# Patient Record
Sex: Male | Born: 1940 | Race: White | Hispanic: No | Marital: Married | Smoking: Former smoker
Health system: Southern US, Community
[De-identification: ages and names within clinical notes are randomized; demographics above are authoritative.]

## PROBLEM LIST (undated history)

## (undated) DIAGNOSIS — I10 Essential (primary) hypertension: Secondary | ICD-10-CM

## (undated) DIAGNOSIS — M25472 Effusion, left ankle: Secondary | ICD-10-CM

## (undated) DIAGNOSIS — E119 Type 2 diabetes mellitus without complications: Secondary | ICD-10-CM

## (undated) DIAGNOSIS — M25471 Effusion, right ankle: Secondary | ICD-10-CM

## (undated) DIAGNOSIS — E7800 Pure hypercholesterolemia, unspecified: Secondary | ICD-10-CM

## (undated) DIAGNOSIS — G459 Transient cerebral ischemic attack, unspecified: Secondary | ICD-10-CM

## (undated) DIAGNOSIS — I639 Cerebral infarction, unspecified: Secondary | ICD-10-CM

## (undated) HISTORY — PX: HERNIA REPAIR: SHX51

## (undated) HISTORY — DX: Effusion, left ankle: M25.472

## (undated) HISTORY — DX: Cerebral infarction, unspecified: I63.9

## (undated) HISTORY — DX: Effusion, left ankle: M25.471

## (undated) HISTORY — PX: TONSILLECTOMY: SUR1361

---

## 2009-07-10 DIAGNOSIS — G459 Transient cerebral ischemic attack, unspecified: Secondary | ICD-10-CM

## 2009-07-10 HISTORY — DX: Transient cerebral ischemic attack, unspecified: G45.9

## 2010-07-10 HISTORY — PX: CATARACT EXTRACTION: SUR2

## 2010-07-26 ENCOUNTER — Ambulatory Visit: Payer: Self-pay | Admitting: Ophthalmology

## 2010-08-02 ENCOUNTER — Ambulatory Visit: Payer: Self-pay | Admitting: Ophthalmology

## 2010-09-23 ENCOUNTER — Ambulatory Visit: Payer: Self-pay | Admitting: Ophthalmology

## 2013-07-10 HISTORY — PX: CYSTOSCOPY: SUR368

## 2014-11-16 ENCOUNTER — Inpatient Hospital Stay (HOSPITAL_COMMUNITY)
Admission: EM | Admit: 2014-11-16 | Discharge: 2014-11-18 | DRG: 065 | Disposition: A | Discharge status: Home / Self Care | Payer: Medicare Other | Attending: Internal Medicine | Admitting: Internal Medicine

## 2014-11-16 ENCOUNTER — Encounter (HOSPITAL_COMMUNITY): Payer: Self-pay | Admitting: *Deleted

## 2014-11-16 ENCOUNTER — Emergency Department (HOSPITAL_COMMUNITY): Payer: Medicare Other

## 2014-11-16 DIAGNOSIS — I10 Essential (primary) hypertension: Secondary | ICD-10-CM | POA: Diagnosis not present

## 2014-11-16 DIAGNOSIS — F419 Anxiety disorder, unspecified: Secondary | ICD-10-CM | POA: Diagnosis present

## 2014-11-16 DIAGNOSIS — E119 Type 2 diabetes mellitus without complications: Secondary | ICD-10-CM

## 2014-11-16 DIAGNOSIS — R4701 Aphasia: Secondary | ICD-10-CM | POA: Diagnosis present

## 2014-11-16 DIAGNOSIS — I638 Other cerebral infarction: Secondary | ICD-10-CM | POA: Diagnosis not present

## 2014-11-16 DIAGNOSIS — E6609 Other obesity due to excess calories: Secondary | ICD-10-CM | POA: Diagnosis present

## 2014-11-16 DIAGNOSIS — E785 Hyperlipidemia, unspecified: Secondary | ICD-10-CM | POA: Diagnosis present

## 2014-11-16 DIAGNOSIS — Z6834 Body mass index (BMI) 34.0-34.9, adult: Secondary | ICD-10-CM

## 2014-11-16 DIAGNOSIS — Z79899 Other long term (current) drug therapy: Secondary | ICD-10-CM

## 2014-11-16 DIAGNOSIS — I639 Cerebral infarction, unspecified: Secondary | ICD-10-CM

## 2014-11-16 DIAGNOSIS — Z7982 Long term (current) use of aspirin: Secondary | ICD-10-CM

## 2014-11-16 DIAGNOSIS — F329 Major depressive disorder, single episode, unspecified: Secondary | ICD-10-CM | POA: Diagnosis present

## 2014-11-16 DIAGNOSIS — E780 Pure hypercholesterolemia: Secondary | ICD-10-CM | POA: Diagnosis present

## 2014-11-16 DIAGNOSIS — I82531 Chronic embolism and thrombosis of right popliteal vein: Secondary | ICD-10-CM | POA: Diagnosis present

## 2014-11-16 DIAGNOSIS — I82512 Chronic embolism and thrombosis of left femoral vein: Secondary | ICD-10-CM | POA: Diagnosis present

## 2014-11-16 DIAGNOSIS — R4781 Slurred speech: Secondary | ICD-10-CM | POA: Diagnosis not present

## 2014-11-16 DIAGNOSIS — Z8673 Personal history of transient ischemic attack (TIA), and cerebral infarction without residual deficits: Secondary | ICD-10-CM

## 2014-11-16 HISTORY — DX: Pure hypercholesterolemia, unspecified: E78.00

## 2014-11-16 HISTORY — DX: Type 2 diabetes mellitus without complications: E11.9

## 2014-11-16 HISTORY — DX: Essential (primary) hypertension: I10

## 2014-11-16 HISTORY — DX: Transient cerebral ischemic attack, unspecified: G45.9

## 2014-11-16 LAB — COMPREHENSIVE METABOLIC PANEL
ALK PHOS: 49 U/L (ref 38–126)
ALT: 13 U/L — AB (ref 17–63)
AST: 23 U/L (ref 15–41)
Albumin: 3.6 g/dL (ref 3.5–5.0)
Anion gap: 9 (ref 5–15)
BUN: 19 mg/dL (ref 6–20)
CO2: 25 mmol/L (ref 22–32)
Calcium: 9.7 mg/dL (ref 8.9–10.3)
Chloride: 102 mmol/L (ref 101–111)
Creatinine, Ser: 1.06 mg/dL (ref 0.61–1.24)
GLUCOSE: 143 mg/dL — AB (ref 70–99)
POTASSIUM: 4 mmol/L (ref 3.5–5.1)
SODIUM: 136 mmol/L (ref 135–145)
Total Bilirubin: 1 mg/dL (ref 0.3–1.2)
Total Protein: 6.7 g/dL (ref 6.5–8.1)

## 2014-11-16 LAB — APTT: aPTT: 27 seconds (ref 24–37)

## 2014-11-16 LAB — I-STAT TROPONIN, ED: TROPONIN I, POC: 0.01 ng/mL (ref 0.00–0.08)

## 2014-11-16 LAB — DIFFERENTIAL
BASOS ABS: 0 10*3/uL (ref 0.0–0.1)
BASOS PCT: 0 % (ref 0–1)
EOS ABS: 0 10*3/uL (ref 0.0–0.7)
Eosinophils Relative: 0 % (ref 0–5)
Lymphocytes Relative: 22 % (ref 12–46)
Lymphs Abs: 1.7 10*3/uL (ref 0.7–4.0)
MONO ABS: 0.7 10*3/uL (ref 0.1–1.0)
Monocytes Relative: 10 % (ref 3–12)
NEUTROS ABS: 5.2 10*3/uL (ref 1.7–7.7)
NEUTROS PCT: 68 % (ref 43–77)

## 2014-11-16 LAB — CBC
HCT: 43.9 % (ref 39.0–52.0)
HEMOGLOBIN: 14.8 g/dL (ref 13.0–17.0)
MCH: 32 pg (ref 26.0–34.0)
MCHC: 33.7 g/dL (ref 30.0–36.0)
MCV: 94.8 fL (ref 78.0–100.0)
Platelets: 136 10*3/uL — ABNORMAL LOW (ref 150–400)
RBC: 4.63 MIL/uL (ref 4.22–5.81)
RDW: 14.2 % (ref 11.5–15.5)
WBC: 7.7 10*3/uL (ref 4.0–10.5)

## 2014-11-16 LAB — PROTIME-INR
INR: 1.05 (ref 0.00–1.49)
PROTHROMBIN TIME: 13.8 s (ref 11.6–15.2)

## 2014-11-16 NOTE — ED Notes (Signed)
MD at bedside. 

## 2014-11-16 NOTE — H&P (Signed)
[facility] History and Physical  Eugene Dudley R Blust MWN:[id number]:[id number] DOB: 04/12/1941    PCP:   PROVIDER NOT IN SYSTEM   Chief Complaint: slurred speech, found to have acute CVA.   HPI: Eugene Dudley is an 74 y.o. male with hx of HTN, DM2, prior TIA and CVA over 10 years ago, hx of HLD on statin, presented to the ER with slurred speech.  He did not have any focal weakness, and in the ER, his speech was improving. He was not a candidate for TPA as his symptoms were mild and he improved.  Work up in the ER included a head CT which showed no acute process, and an MRI showed acute left lateral and posterior temporal and occipital CVA, small.  He denied facial droop, focal weakness, or visual changes.  He was seen in consultation with neurohospitalist, who recommended that he be admitted for stroke work up.  He has been taking ASA, and in the past, had taken Plavix, but he " didn't like how it made me feel".   Hospitalist was asked to admit him for same.   Rewiew of Systems:  Constitutional: Negative for malaise, fever and chills. No significant weight loss or weight gain Eyes: Negative for eye pain, redness and discharge, diplopia, visual changes, or flashes of light. ENMT: Negative for ear pain, hoarseness, nasal congestion, sinus pressure and sore throat. No headaches; tinnitus, drooling, or problem swallowing. Cardiovascular: Negative for chest pain, palpitations, diaphoresis, dyspnea and peripheral edema. ; No orthopnea, PND Respiratory: Negative for cough, hemoptysis, wheezing and stridor. No pleuritic chestpain. Gastrointestinal: Negative for nausea, vomiting, diarrhea, constipation, abdominal pain, melena, blood in stool, hematemesis, jaundice and rectal bleeding.    Genitourinary: Negative for frequency, dysuria, incontinence,flank pain and hematuria; Musculoskeletal: Negative for back pain and neck pain. Negative for swelling and trauma.;  Skin: . Negative for pruritus, rash, abrasions,  bruising and skin lesion.; ulcerations Neuro: Negative for , lightheadedness and neck stiffness. Negative for weakness, altered level of consciousness , altered mental status, extremity weakness, burning feet, involuntary movement, seizure and syncope.  Psych: negative for anxiety, depression, insomnia, tearfulness, panic attacks, hallucinations, paranoia, suicidal or homicidal ideation    Past Medical History  Diagnosis Date  . Diabetes mellitus without complication   . Hypertension   . TIA (transient ischemic attack) 2011  . Hypercholesterolemia     Past Surgical History  Procedure Laterality Date  . Hernia repair    . Tonsillectomy      Medications:  HOME MEDS: Prior to Admission medications   Medication Sig Start Date End Date Taking? Authorizing Provider  alfuzosin (UROXATRAL) 10 MG 24 hr tablet Take 10 mg by mouth daily.   Yes Historical Provider, MD  Cholecalciferol 1000 UNITS capsule Take 1,000 Units by mouth daily.   Yes Historical Provider, MD  clonazePAM (KLONOPIN) 0.5 MG tablet Take 0.5 mg by mouth 4 (four) times daily.   Yes Historical Provider, MD  lisinopril (PRINIVIL,ZESTRIL) 10 MG tablet Take 5 mg by mouth at bedtime.   Yes Historical Provider, MD  metFORMIN (GLUCOPHAGE) 500 MG tablet Take 500 mg by mouth 2 (two) times daily.   Yes Historical Provider, MD  pantoprazole (PROTONIX) 40 MG tablet Take 40 mg by mouth 2 (two) times daily.   Yes Historical Provider, MD  PARoxetine (PAXIL) 20 MG tablet Take 60 mg by mouth daily.   Yes Historical Provider, MD  simvastatin (ZOCOR) 40 MG tablet Take 20 mg by mouth daily.   Yes Historical Provider,  MD     Allergies:  No Known Allergies  Social History:   reports that he has never smoked. He has never used smokeless tobacco. He reports that he does not drink alcohol or use illicit drugs.  Family History: History reviewed. No pertinent family history.   Physical Exam: Filed Vitals:   11/16/14 2109 11/16/14 2130  11/16/14 2200 11/16/14 2230  BP: 117/67 125/64 115/71 93/76  Pulse: 73 70 70 106  Temp:      TempSrc:      Resp: SpO2: 96% 100% 97% 97%   Blood pressure 93/76, pulse 106, temperature 98.5 F (36.9 C), temperature source Oral, resp. rate 19, SpO2 97 %.  GEN:  Pleasant patient lying in the stretcher in no acute distress; cooperative with exam. PSYCH:  alert and oriented x4; does not appear anxious or depressed; affect is appropriate. HEENT: Mucous membranes pink and anicteric; PERRLA; EOM intact; no cervical lymphadenopathy nor thyromegaly or carotid bruit; no JVD; There were no stridor. Neck is very supple. Breasts:: Not examined CHEST WALL: No tenderness CHEST: Normal respiration, clear to auscultation bilaterally.  HEART: Regular rate and rhythm.  There are no murmur, rub, or gallops.   BACK: No kyphosis or scoliosis; no CVA tenderness ABDOMEN: soft and non-tender; no masses, no organomegaly, normal abdominal bowel sounds; no pannus; no intertriginous candida. There is no rebound and no distention. Rectal Exam: Not done EXTREMITIES: No bone or joint deformity; age-appropriate arthropathy of the hands and knees; no edema; no ulcerations.  There is no calf tenderness. Genitalia: not examined PULSES: 2+ and symmetric SKIN: Normal hydration no rash or ulceration CNS: Cranial nerves 2-12 grossly intact no focal lateralizing neurologic deficit.  Speech is slightly slurred, and slightly with difficult word finding problem, almost indistinguishable.  uvula elevated with phonation, facial symmetry and tongue midline. DTR are normal bilaterally, cerebella exam is intact, barbinski is negative and strengths are equaled bilaterally.  No sensory loss.   Labs on Admission:  Basic Metabolic Panel:  Recent Labs Lab 11/16/14 1743  NA 136  K 4.0  CL 102  CO2 25  GLUCOSE 143*  BUN 19  CREATININE 1.06  CALCIUM 9.7   Liver Function Tests:  Recent Labs Lab 11/16/14 1743  AST 23   ALT 13*  ALKPHOS 49  BILITOT 1.0  PROT 6.7  ALBUMIN 3.6   No results for input(s): LIPASE, AMYLASE in the last 168 hours. No results for input(s): AMMONIA in the last 168 hours. CBC:  Recent Labs Lab 11/16/14 1743  WBC 7.7  NEUTROABS 5.2  HGB 14.8  HCT 43.9  MCV 94.8  PLT 136*   Cardiac Enzymes: No results for input(s): CKTOTAL, CKMB, CKMBINDEX, TROPONINI in the last 168 hours.  CBG: No results for input(s): GLUCAP in the last 168 hours.   Radiological Exams on Admission: Ct Head Wo Contrast  11/16/2014   CLINICAL DATA:  Woke up with headache.  aphasia.  EXAM: CT HEAD WITHOUT CONTRAST  TECHNIQUE: Contiguous axial images were obtained from the base of the skull through the vertex without intravenous contrast.  COMPARISON:  None.  FINDINGS: Prominence of the sulci and ventricles are identified consistent with brain atrophy. Small, chronic appearing bilateral lacunar infarcts noted within the basal ganglia. No acute cortical infarct, hemorrhage, or mass lesion ispresent. Ventricles are of normal size. No significant extra-axial fluid collection is present. Mucosal thickening involving the anterior ethmoid air cells noted. There is also mild mucosal thickening involving the frontal  sinus. The paranasal sinuses andmastoid air cells are clear. The osseous skull is intact.  IMPRESSION: 1. No acute intracranial abnormalities. 2. Atrophy and chronic microvascular disease noted.   Electronically Signed   By: Signa Kellaylor  Stroud M.D.   On: 11/16/2014 18:47   [name] (neuro Protocol)  11/16/2014   CLINICAL DATA:  Patient woke up with a headache. Later, the patient developed aphasia. Stroke risk factors include diabetes and hypertension.  EXAM: MRI HEAD WITHOUT CONTRAST  TECHNIQUE: Multiplanar, multiecho pulse sequences of the brain and surrounding structures were obtained without intravenous contrast.  COMPARISON:  CT head earlier today.  No prior imaging.  FINDINGS: Small focus of  restricted diffusion in the LEFT lateral posterior temporal occipital region, 5 mm diameter, involving the cortex, consistent with acute infarction. No areas of restricted diffusion are seen elsewhere, specifically none within Broca's area. The small area of infarction probably corresponds to LEFT MCA territory, although its location approaches the watershed zone between LEFT MCA and LEFT PCA.  No evidence for acute hemorrhage, mass lesion, hydrocephalus, or extra-axial fluid. Moderately advanced cerebral and cerebellar atrophy. Mild to moderate T2 and FLAIR hyperintensities throughout periventricular and subcortical white matter likely representing small vessel disease.  Pituitary, pineal, and cerebellar tonsils unremarkable. No upper cervical lesions. Flow voids are maintained throughout the carotid, basilar, and vertebral arteries.  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 Prominent perivascular spaces are noted.  Visualized calvarium, skull base, and upper cervical osseous structures unremarkable. Scalp and extracranial soft tissues, orbits, sinuses, and mastoids show no acute process.  IMPRESSION: Subcentimeter area of acute infarction affects the LEFT lateral and posterior temporal occipital region. No associated hemorrhage.  Moderately Advanced atrophy and small vessel disease.   Electronically Signed   By: Davonna BellingJohn  Curnes M.D.   On: 11/16/2014 20:46    EKG: Independently reviewed. NSR.    Assessment/Plan Present on Admission:  . Acute CVA (cerebrovascular accident) . HTN (hypertension), benign . Hyperlipidemia  PLAN:  Will admit him for stroke work up to include /MRA of the brain, carotid US, and ECHO.  Will continue with full dose ASA as per neuro recommendation.  Will continue with his statin, and place on telemetry.  He is stable, improving, and [facility]  Thank you for  allowing me to participate in his care.   Other plans as per orders.  Code Status: FULL Unk LightningODE.    Brynlynn Walko, MD. [facility] Pager [phone] 7pm to 7am.  11/16/2014, 10:53 PM

## 2014-11-16 NOTE — ED Notes (Signed)
Patient transported to MRI 

## 2014-11-16 NOTE — ED Provider Notes (Signed)
CSN: [id number]     Arrival date & time 11/16/14  1705 History   First MD Initiated Contact with Patient 11/16/14 1706     Chief Complaint  Patient presents with  . Aphasia    The history is provided by the patient. No language interpreter was used.   [name] presents for evaluation of headache.  At 2am he awoke with a headache and took two aspirin.  The headache was diffuse in nature and gradual in onset.  He is not sure if it is the worst headache of his life.  Just prior to ED arrival he took another aspirin for headache and now the HA is gone.  He denies fevers, numbness, weakness.  He had associated vomiting, nausea, speech difficulties.  He ate somewhere between 4 and 6 salted tomato sandwiches prior to developing the headache.   He has a h/o TIA, DM Sxs are moderate, constant, waxing and waning.    No past medical history on file. No past surgical history on file. No family history on file. History  Substance Use Topics  . Smoking status: Not on file  . Smokeless tobacco: Not on file  . Alcohol Use: Not on file    Review of Systems  All other systems reviewed and are negative.     Allergies  Review of patient's allergies indicates not on file.  Home Medications   Prior to Admission medications   Not on File   SpO2 95% Physical Exam  Constitutional: He is oriented to person, place, and time. He appears well-developed and well-nourished.  HENT:  Head: Normocephalic and atraumatic.  Eyes: EOM are normal. Pupils are equal, round, and reactive to light.  Cardiovascular: Normal rate and regular rhythm.   No murmur heard. Pulmonary/Chest: Effort normal and breath sounds normal. No respiratory distress.  Abdominal: Soft. There is no tenderness. There is no rebound and no guarding.  Supraumbilical hernia, reducible on exam.   Musculoskeletal: He exhibits no edema or tenderness.  Neurological: He is alert and oriented to person, place, and time. No cranial nerve  deficit.  Intermittent expressive aphasia. MAE symmetrically.  Skin: Skin is warm and dry.  Psychiatric: He has a normal mood and affect. His behavior is normal.  Nursing note and vitals reviewed.   ED Course  Procedures (including critical care time) Labs Review Labs Reviewed  CBC - Abnormal; Notable for the following:    Platelets 136 (*)    All other components within normal limits  COMPREHENSIVE METABOLIC PANEL - Abnormal; Notable for the following:    Glucose, Bld 143 (*)    ALT 13 (*)    All other components within normal limits  GLUCOSE, CAPILLARY - Abnormal; Notable for the following:    Glucose-Capillary 105 (*)    All other components within normal limits  PROTIME-INR  APTT  DIFFERENTIAL  HEMOGLOBIN A1C  LIPID PANEL  URINE RAPID DRUG SCREEN (HOSP PERFORMED)  CBC  CREATININE, SERUM  I-STAT TROPOININ, ED    Imaging Review Ct Head Wo Contrast  11/16/2014   CLINICAL DATA:  Woke up with headache.  aphasia.  EXAM: CT HEAD WITHOUT CONTRAST  TECHNIQUE: Contiguous axial images were obtained from the base of the skull through the vertex without intravenous contrast.  COMPARISON:  None.  FINDINGS: Prominence of the sulci and ventricles are identified consistent with brain atrophy. Small, chronic appearing bilateral lacunar infarcts noted within the basal ganglia. No acute cortical infarct, hemorrhage, or mass lesion ispresent. Ventricles are of normal  size. No significant extra-axial fluid collection is present. Mucosal thickening involving the anterior ethmoid air cells noted. There is also mild mucosal thickening involving the frontal sinus. The paranasal sinuses andmastoid air cells are clear. The osseous skull is intact.  IMPRESSION: 1. No acute intracranial abnormalities. 2. Atrophy and chronic microvascular disease noted.   Electronically Signed   By: Signa Kellaylor  Stroud M.D.   On: 11/16/2014 18:47   [name] (neuro Protocol)  11/16/2014   CLINICAL DATA:  Patient woke  up with a headache. Later, the patient developed aphasia. Stroke risk factors include diabetes and hypertension.  EXAM: MRI HEAD WITHOUT CONTRAST  TECHNIQUE: Multiplanar, multiecho pulse sequences of the brain and surrounding structures were obtained without intravenous contrast.  COMPARISON:  CT head earlier today.  No prior imaging.  FINDINGS: Small focus of restricted diffusion in the LEFT lateral posterior temporal occipital region, 5 mm diameter, involving the cortex, consistent with acute infarction. No areas of restricted diffusion are seen elsewhere, specifically none within Broca's area. The small area of infarction probably corresponds to LEFT MCA territory, although its location approaches the watershed zone between LEFT MCA and LEFT PCA.  No evidence for acute hemorrhage, mass lesion, hydrocephalus, or extra-axial fluid. Moderately advanced cerebral and cerebellar atrophy. Mild to moderate T2 and FLAIR hyperintensities throughout periventricular and subcortical white matter likely representing small vessel disease.  Pituitary, pineal, and cerebellar tonsils unremarkable. No upper cervical lesions. Flow voids are maintained throughout the carotid, basilar, and vertebral arteries.  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 Prominent perivascular spaces are noted.  Visualized calvarium, skull base, and upper cervical osseous structures unremarkable. Scalp and extracranial soft tissues, orbits, sinuses, and mastoids show no acute process.  IMPRESSION: Subcentimeter area of acute infarction affects the LEFT lateral and posterior temporal occipital region. No associated hemorrhage.  Moderately Advanced atrophy and small vessel disease.   Electronically Signed   By: Davonna BellingJohn  Curnes M.D.   On: 11/16/2014 20:46     EKG Interpretation   Date/Time:  Monday Nov 16 2014 17:08:44 EDT Ventricular Rate:  85 PR  Interval:  211 QRS Duration: 101 QT Interval:  380 QTC Calculation: 452 R Axis:   66 Text Interpretation:  Sinus rhythm Low voltage, precordial leads Confirmed  by Lincoln Brighamees, Liz [phone]([id number]) on 11/16/2014 5:52:59 PM      MDM   Final diagnoses:  Acute CVA (cerebrovascular accident)    Pt here for evaluation of HA and intermittent expressive aphasia.  CT head negative but MRI with acute infarct.  Informed patient of findings of studies.  Discussed with Neuro hospitalist and Medicine service.  Patient is not a TPA candidate given normal NIHSS and duration of sxs.     Tilden FossaElizabeth Kately Graffam, MD 11/17/14 [phone]

## 2014-11-16 NOTE — ED Notes (Signed)
Pt arrives from home via EMS. Pt states he woke up at 0200 this morning with a h/a and around 1300 took 2 aspirin for the ha and no longer has any pain. EMS reports aphasia that worsened on transport. EMS also reports pt aphasia became worse during transport to hospital.

## 2014-11-16 NOTE — ED Notes (Signed)
Attempt to call report; Secretary needs to consult with charge to ensure pt is still coming to floor

## 2014-11-16 NOTE — Consult Note (Signed)
Stroke Consult    Chief Complaint: headache and difficulty speaking  HPI: Eugene Dudley is an 74 y.o. male hx of HTN, DM, TIA, HLD coming in for evaluation of headache and difficulty talking. Woke up at 2am with headache, described as generalized pressure type pain with associated n/v. No photo or phonophobia. Took ASA and headache resolved prior to ED arrival. Also noted speech difficulty, described as trouble getting his words out. Of note he has reports taking plavix in the past and having a bad reaction. Is currently taking ASA 325mg  daily.   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    Past Medical History  Diagnosis Date  . Diabetes mellitus without complication   . Hypertension   . TIA (transient ischemic attack) 2011  . Hypercholesterolemia     Past Surgical History  Procedure Laterality Date  . Hernia repair    . Tonsillectomy      History reviewed. No pertinent family history. Social History:  reports that he has never smoked. He has never used smokeless tobacco. He reports that he does not drink alcohol or use illicit drugs.  Allergies: No Known Allergies   (Not in a hospital admission)  ROS: Out of a complete 14 system review, the patient complains of only the following symptoms, and all other reviewed systems are negative. +Headache, speech difficulty  Physical Examination: Filed Vitals:   11/16/14 2109  BP: 117/67  Pulse: 73  Temp:   Resp: 14   Physical Exam  Constitutional: He appears well-developed and well-nourished.  Psych: Affect appropriate to situation Eyes: No scleral injection HENT: No OP obstrucion Head: Normocephalic.  Cardiovascular: Normal rate and regular rhythm.  Respiratory: Effort normal and breath sounds normal.  GI: Soft. Bowel sounds are normal. No distension. There is no tenderness.  Skin: WDI  Neurologic Examination: Mental Status: Alert, oriented, thought content appropriate.  Minimal word  finding difficulties.  No dysarthria. Able to follow 3 step commands without difficulty. Cranial Nerves: II: funduscopic exam wnl bilaterally, visual fields grossly normal, pupils equal, round, reactive to light and accommodation III,IV, VI: ptosis not present, extra-ocular motions intact bilaterally V,VII: smile symmetric, facial light touch sensation normal bilaterally VIII: hearing normal bilaterally IX,X: gag reflex present XI: trapezius strength/neck flexion strength normal bilaterally XII: tongue strength normal  Motor: Right : Upper extremity    Left:     Upper extremity 5/5 deltoid       5/5 deltoid 5/5 biceps      5/5 biceps  5/5 triceps      5/5 triceps 5/5 hand grip      5/5 hand grip  Lower extremity     Lower extremity 5/5 hip flexor      5/5 hip flexor 5/5 quadricep      5/5 quadriceps  5/5 hamstrings     5/5 hamstrings 5/5 plantar flexion       5/5 plantar flexion 5/5 plantar extension     5/5 plantar extension Tone and bulk:normal tone throughout; no atrophy noted Sensory: Pinprick and light touch intact throughout, bilaterally Deep Tendon Reflexes: 2+ and symmetric throughout Plantars: Right: downgoing   Left: downgoing Cerebellar: normal finger-to-nose, normal rapid alternating movements and normal heel-to-shin test Gait: deferred  Laboratory Studies:   Basic Metabolic Panel:  Recent Labs Lab 11/16/14 1743  NA 136  K 4.0  CL 102  CO2 25  GLUCOSE 143*  BUN 19  CREATININE 1.06  CALCIUM 9.7    Liver Function Tests:  Recent Labs Lab 11/16/14 1743  AST 23  ALT 13*  ALKPHOS 49  BILITOT 1.0  PROT 6.7  ALBUMIN 3.6   No results for input(s): LIPASE, AMYLASE in the last 168 hours. No results for input(s): AMMONIA in the last 168 hours.  CBC:  Recent Labs Lab 11/16/14 1743  WBC 7.7  NEUTROABS 5.2  HGB 14.8  HCT 43.9  MCV 94.8  PLT 136*    Cardiac Enzymes: No results for input(s): CKTOTAL, CKMB, CKMBINDEX, TROPONINI in the last 168  hours.  BNP: Invalid input(s): POCBNP  CBG: No results for input(s): GLUCAP in the last 168 hours.  Microbiology: No results found for this or any previous visit.  Coagulation Studies:  Recent Labs  11/16/14 1743  LABPROT 13.8  INR 1.05    Urinalysis: No results for input(s): COLORURINE, LABSPEC, PHURINE, GLUCOSEU, HGBUR, BILIRUBINUR, KETONESUR, PROTEINUR, UROBILINOGEN, NITRITE, LEUKOCYTESUR in the last 168 hours.  Invalid input(s): APPERANCEUR  Lipid Panel:  No results found for: CHOL, TRIG, HDL, CHOLHDL, VLDL, LDLCALC  HgbA1C: No results found for: HGBA1C  Urine Drug Screen:  No results found for: LABOPIA, COCAINSCRNUR, LABBENZ, AMPHETMU, THCU, LABBARB  Alcohol Level: No results for input(s): ETH in the last 168 hours.  Other results: EKG: normal sinus rhythm.  Imaging: Ct Head Wo Contrast  11/16/2014   CLINICAL DATA:  Woke up with headache.  aphasia.  EXAM: CT HEAD WITHOUT CONTRAST  TECHNIQUE: Contiguous axial images were obtained from the base of the skull through the vertex without intravenous contrast.  COMPARISON:  None.  FINDINGS: Prominence of the sulci and ventricles are identified consistent with brain atrophy. Small, chronic appearing bilateral lacunar infarcts noted within the basal ganglia. No acute cortical infarct, hemorrhage, or mass lesion ispresent. Ventricles are of normal size. No significant extra-axial fluid collection is present. Mucosal thickening involving the anterior ethmoid air cells noted. There is also mild mucosal thickening involving the frontal sinus. The paranasal sinuses andmastoid air cells are clear. The osseous skull is intact.  IMPRESSION: 1. No acute intracranial abnormalities. 2. Atrophy and chronic microvascular disease noted.   Electronically Signed   By: Signa Kell M.D.   On: 11/16/2014 18:47   [name] (neuro Protocol)  11/16/2014   CLINICAL DATA:  Patient woke up with a headache. Later, the patient developed aphasia.  Stroke risk factors include diabetes and hypertension.  EXAM: MRI HEAD WITHOUT CONTRAST  TECHNIQUE: Multiplanar, multiecho pulse sequences of the brain and surrounding structures were obtained without intravenous contrast.  COMPARISON:  CT head earlier today.  No prior imaging.  FINDINGS: Small focus of restricted diffusion in the LEFT lateral posterior temporal occipital region, 5 mm diameter, involving the cortex, consistent with acute infarction. No areas of restricted diffusion are seen elsewhere, specifically none within Broca's area. The small area of infarction probably corresponds to LEFT MCA territory, although its location approaches the watershed zone between LEFT MCA and LEFT PCA.  No evidence for acute hemorrhage, mass lesion, hydrocephalus, or extra-axial fluid. Moderately advanced cerebral and cerebellar atrophy. Mild to moderate T2 and FLAIR hyperintensities throughout periventricular and subcortical white matter likely representing small vessel disease.  Pituitary, pineal, and cerebellar tonsils unremarkable. No upper cervical lesions. Flow voids are maintained throughout the carotid, basilar, and vertebral arteries.  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 Prominent perivascular spaces are noted.  Visualized calvarium, skull base, and upper cervical osseous structures unremarkable. Scalp  and extracranial soft tissues, orbits, sinuses, and mastoids show no acute process.  IMPRESSION: Subcentimeter area of acute infarction affects the LEFT lateral and posterior temporal occipital region. No associated hemorrhage.  Moderately Advanced atrophy and small vessel disease.   Electronically Signed   By: Davonna BellingJohn  Curnes M.D.   On: 11/16/2014 20:46    Assessment: 74 y.o. male hx of HTN, HLD, DM, prior TIA presents with acute onset of headache and speech difficulty. MRI brain shows small acute left  temporal infarct, suspect small vessel etiology. Will be admitted for further stroke workup.    Plan: 1. HgbA1c, fasting lipid panel 2. MRA  of the brain without contrast 3. PT consult, OT consult, Speech consult 4. Echocardiogram 5. Carotid dopplers 6. Prophylactic therapy-ASA 325mg  7. Risk factor modification 8. Telemetry monitoring 9. Frequent neuro checks 10. NPO until RN stroke swallow screen    Elspeth Choeter Issis Lindseth, DO [facility] [phone]  If 7pm- 7am, please page neurology on call as listed in [facility]. 11/16/2014, 9:29 PM

## 2014-11-17 ENCOUNTER — Encounter (HOSPITAL_COMMUNITY): Payer: Self-pay | Admitting: *Deleted

## 2014-11-17 ENCOUNTER — Observation Stay (HOSPITAL_COMMUNITY): Payer: Medicare Other

## 2014-11-17 DIAGNOSIS — I639 Cerebral infarction, unspecified: Secondary | ICD-10-CM | POA: Diagnosis not present

## 2014-11-17 DIAGNOSIS — R632 Polyphagia: Secondary | ICD-10-CM

## 2014-11-17 DIAGNOSIS — F419 Anxiety disorder, unspecified: Secondary | ICD-10-CM | POA: Diagnosis present

## 2014-11-17 DIAGNOSIS — E6609 Other obesity due to excess calories: Secondary | ICD-10-CM | POA: Diagnosis present

## 2014-11-17 DIAGNOSIS — I638 Other cerebral infarction: Secondary | ICD-10-CM | POA: Diagnosis present

## 2014-11-17 DIAGNOSIS — E119 Type 2 diabetes mellitus without complications: Secondary | ICD-10-CM | POA: Diagnosis present

## 2014-11-17 DIAGNOSIS — Z7982 Long term (current) use of aspirin: Secondary | ICD-10-CM | POA: Diagnosis not present

## 2014-11-17 DIAGNOSIS — E1159 Type 2 diabetes mellitus with other circulatory complications: Secondary | ICD-10-CM | POA: Diagnosis not present

## 2014-11-17 DIAGNOSIS — F329 Major depressive disorder, single episode, unspecified: Secondary | ICD-10-CM | POA: Diagnosis present

## 2014-11-17 DIAGNOSIS — E785 Hyperlipidemia, unspecified: Secondary | ICD-10-CM | POA: Diagnosis not present

## 2014-11-17 DIAGNOSIS — I82531 Chronic embolism and thrombosis of right popliteal vein: Secondary | ICD-10-CM | POA: Diagnosis present

## 2014-11-17 DIAGNOSIS — E780 Pure hypercholesterolemia: Secondary | ICD-10-CM | POA: Diagnosis present

## 2014-11-17 DIAGNOSIS — G9340 Encephalopathy, unspecified: Secondary | ICD-10-CM | POA: Diagnosis not present

## 2014-11-17 DIAGNOSIS — Z6834 Body mass index (BMI) 34.0-34.9, adult: Secondary | ICD-10-CM | POA: Diagnosis not present

## 2014-11-17 DIAGNOSIS — I82512 Chronic embolism and thrombosis of left femoral vein: Secondary | ICD-10-CM | POA: Diagnosis present

## 2014-11-17 DIAGNOSIS — Z8673 Personal history of transient ischemic attack (TIA), and cerebral infarction without residual deficits: Secondary | ICD-10-CM | POA: Diagnosis not present

## 2014-11-17 DIAGNOSIS — M7989 Other specified soft tissue disorders: Secondary | ICD-10-CM | POA: Diagnosis not present

## 2014-11-17 DIAGNOSIS — E118 Type 2 diabetes mellitus with unspecified complications: Secondary | ICD-10-CM

## 2014-11-17 DIAGNOSIS — R4781 Slurred speech: Secondary | ICD-10-CM | POA: Diagnosis present

## 2014-11-17 DIAGNOSIS — I6789 Other cerebrovascular disease: Secondary | ICD-10-CM | POA: Diagnosis not present

## 2014-11-17 DIAGNOSIS — Z79899 Other long term (current) drug therapy: Secondary | ICD-10-CM | POA: Diagnosis not present

## 2014-11-17 DIAGNOSIS — I10 Essential (primary) hypertension: Secondary | ICD-10-CM | POA: Diagnosis not present

## 2014-11-17 DIAGNOSIS — R4701 Aphasia: Secondary | ICD-10-CM | POA: Diagnosis present

## 2014-11-17 LAB — LIPID PANEL
CHOL/HDL RATIO: 2.6 ratio
Cholesterol: 92 mg/dL (ref 0–200)
HDL: 35 mg/dL — AB (ref >40)
LDL CALC: 25 mg/dL (ref 0–99)
TRIGLYCERIDES: 160 mg/dL — AB (ref <150)
VLDL: 32 mg/dL (ref 0–40)

## 2014-11-17 LAB — GLUCOSE, CAPILLARY
GLUCOSE-CAPILLARY: 93 mg/dL (ref 70–99)
GLUCOSE-CAPILLARY: 124 mg/dL — AB (ref 70–99)
GLUCOSE-CAPILLARY: 105 mg/dL — AB (ref 70–99)
Glucose-Capillary: 105 mg/dL — ABNORMAL HIGH (ref 70–99)
Glucose-Capillary: 106 mg/dL — ABNORMAL HIGH (ref 70–99)

## 2014-11-17 LAB — RAPID URINE DRUG SCREEN, HOSP PERFORMED
Amphetamines: NOT DETECTED
Barbiturates: NOT DETECTED
Benzodiazepines: NOT DETECTED
Cocaine: NOT DETECTED
Opiates: NOT DETECTED
Tetrahydrocannabinol: NOT DETECTED

## 2014-11-17 MED ORDER — ALFUZOSIN HCL ER 10 MG PO TB24
10.0000 mg | ORAL_TABLET | Freq: Every day | ORAL | Status: DC
  Administered 2014-11-17 – 2014-11-18 (×2): 10 mg via ORAL
  Filled 2014-11-17 (×2): qty 1

## 2014-11-17 MED ORDER — INSULIN ASPART 100 UNIT/ML ~~LOC~~ SOLN: 0.0000 [IU] | Freq: Three times a day (TID) | SUBCUTANEOUS | Status: DC

## 2014-11-17 MED ORDER — PAROXETINE HCL 20 MG PO TABS
60.0000 mg | ORAL_TABLET | Freq: Every day | ORAL | Status: DC
  Administered 2014-11-17 – 2014-11-18 (×2): 60 mg via ORAL
  Filled 2014-11-17 (×2): qty 3

## 2014-11-17 MED ORDER — SIMVASTATIN 20 MG PO TABS
20.0000 mg | ORAL_TABLET | Freq: Every day | ORAL | Status: DC
  Administered 2014-11-17 – 2014-11-18 (×2): 20 mg via ORAL
  Filled 2014-11-17 (×2): qty 1

## 2014-11-17 MED ORDER — VITAMIN D 1000 UNITS PO TABS
1000.0000 [IU] | ORAL_TABLET | Freq: Every day | ORAL | Status: DC
  Administered 2014-11-17 – 2014-11-18 (×2): 1000 [IU] via ORAL
  Filled 2014-11-17 (×2): qty 1

## 2014-11-17 MED ORDER — ACETAMINOPHEN 325 MG PO TABS: 650.0000 mg | ORAL_TABLET | ORAL | Status: DC | PRN

## 2014-11-17 MED ORDER — STROKE: EARLY STAGES OF RECOVERY BOOK
Freq: Once | Status: AC
Start: 2014-11-17 — End: 2014-11-17
  Administered 2014-11-17: 01:00:00

## 2014-11-17 MED ORDER — INSULIN ASPART 100 UNIT/ML ~~LOC~~ SOLN: 0.0000 [IU] | Freq: Every day | SUBCUTANEOUS | Status: DC

## 2014-11-17 MED ORDER — ASPIRIN 325 MG PO TABS
325.0000 mg | ORAL_TABLET | Freq: Every day | ORAL | Status: DC
  Administered 2014-11-17 – 2014-11-18 (×2): 325 mg via ORAL
  Filled 2014-11-17 (×2): qty 1

## 2014-11-17 MED ORDER — CLONAZEPAM 0.5 MG PO TABS
0.5000 mg | ORAL_TABLET | Freq: Four times a day (QID) | ORAL | Status: DC
  Administered 2014-11-17 – 2014-11-18 (×7): 0.5 mg via ORAL
  Filled 2014-11-17 (×7): qty 1

## 2014-11-17 MED ORDER — SODIUM CHLORIDE 0.9 % IV SOLN
INTRAVENOUS | Status: DC
  Administered 2014-11-17: 02:00:00 via INTRAVENOUS

## 2014-11-17 MED ORDER — PERFLUTREN LIPID MICROSPHERE
1.0000 mL | INTRAVENOUS | Status: AC | PRN
  Administered 2014-11-17: 3 mL via INTRAVENOUS
  Filled 2014-11-17: qty 10

## 2014-11-17 MED ORDER — HEPARIN SODIUM (PORCINE) 5000 UNIT/ML IJ SOLN
5000.0000 [IU] | Freq: Three times a day (TID) | INTRAMUSCULAR | Status: DC
  Administered 2014-11-17 – 2014-11-18 (×4): 5000 [IU] via SUBCUTANEOUS
  Filled 2014-11-17 (×5): qty 1

## 2014-11-17 MED ORDER — PANTOPRAZOLE SODIUM 40 MG PO TBEC
40.0000 mg | DELAYED_RELEASE_TABLET | Freq: Two times a day (BID) | ORAL | Status: DC
  Administered 2014-11-17 – 2014-11-18 (×4): 40 mg via ORAL
  Filled 2014-11-17 (×4): qty 1

## 2014-11-17 NOTE — Evaluation (Signed)
Physical Therapy Evaluation Patient Details Name: Eugene Dudley MRN: [id number] DOB: 08/23/1940 Today's Date: 11/17/2014   History of Present Illness  pt presents with speech deficits and found to have L Temporal Infarct.  pt with hx of TIA and CVA.    Clinical Impression  Pt moving well and without deficit.  Pt does endorse some visual deficits, but also states he has a cataract in his L eye with plans for intervention.  Pt continues to have some difficulties with word finding, so requested SLP order from MD.  No further PT needs at this time, will sign off.      Follow Up Recommendations No PT follow up;Supervision - Intermittent    Equipment Recommendations  None recommended by PT    Recommendations for Other Services       Precautions / Restrictions Precautions Precautions: None Restrictions Weight Bearing Restrictions: No      Mobility  Bed Mobility Overal bed mobility: Modified Independent             General bed mobility comments: pt elevated HOB slightly and indicates he has multiple pillows at home.    Transfers Overall transfer level: Modified independent Equipment used: None                Ambulation/Gait Ambulation/Gait assistance: Independent Ambulation Distance (Feet): 250 Feet Assistive device: None Gait Pattern/deviations: WFL(Within Functional Limits)     General Gait Details: pt able to change gait speeds, perform head turns, and negotiate obstacles without deficits.    Stairs            Wheelchair Mobility    Modified Rankin (Stroke Patients Only) Modified Rankin (Stroke Patients Only) Pre-Morbid Rankin Score: No significant disability Modified Rankin: Slight disability     Balance Overall balance assessment: No apparent balance deficits (not formally assessed)                                           Pertinent Vitals/Pain Pain Assessment: No/denies pain    Home Living Family/patient expects to be  discharged to:: Private residence Living Arrangements: Alone Available Help at Discharge: Family;Friend(s);Available PRN/intermittently Type of Home: House Home Access: Stairs to enter Entrance Stairs-Rails: Right Entrance Stairs-Number of Steps: 3 Home Layout: One level Home Equipment: None      Prior Function Level of Independence: Independent               Hand Dominance        Extremity/Trunk Assessment   Upper Extremity Assessment: Overall WFL for tasks assessed           Lower Extremity Assessment: Overall WFL for tasks assessed      Cervical / Trunk Assessment: Normal  Communication   Communication:  (Mild word finding)  Cognition Arousal/Alertness: Awake/alert Behavior During Therapy: WFL for tasks assessed/performed Overall Cognitive Status: Within Functional Limits for tasks assessed                      General Comments      Exercises        Assessment/Plan    PT Assessment Patent does not need any further PT services  PT Diagnosis Difficulty walking   PT Problem List    PT Treatment Interventions     PT Goals (Current goals can be found in the Care Plan section) Acute Rehab PT Goals Patient Stated  Goal: Home PT Goal Formulation: All assessment and education complete, DC therapy    Frequency     Barriers to discharge        Co-evaluation               End of Session   Activity Tolerance: Patient tolerated treatment well Patient left: in bed;with call bell/phone within reach;with family/visitor present Nurse Communication: Mobility status    Functional Assessment Tool Used: Clinical Judgement Functional Limitation: Mobility: Walking and moving around Mobility: Walking and Moving Around Current Status (W2956(G8978): 0 percent impaired, limited or restricted Mobility: Walking and Moving Around Goal Status (O1308(G8979): 0 percent impaired, limited or restricted Mobility: Walking and Moving Around Discharge Status [id number](G8980): 0  percent impaired, limited or restricted    Time: 1007-1019 PT Time Calculation (min) (ACUTE ONLY): 12 min   Charges:   PT Evaluation $Initial PT Evaluation Tier I: 1 Procedure     PT G Codes:   PT G-Codes **NOT FOR INPATIENT CLASS** Functional Assessment Tool Used: Clinical Judgement Functional Limitation: Mobility: Walking and moving around Mobility: Walking and Moving Around Current Status (O9629(G8978): 0 percent impaired, limited or restricted Mobility: Walking and Moving Around Goal Status (B2841(G8979): 0 percent impaired, limited or restricted Mobility: Walking and Moving Around Discharge Status (L2440(G8980): 0 percent impaired, limited or restricted    Sunny SchleinRitenour, Eugene Dudley, [place]PT [phone] 11/17/2014, 10:40 AM

## 2014-11-17 NOTE — Progress Notes (Signed)
Pt admitted from ED with stroke like symptoms, denies any pain ,pt settled in bed, telemetry monitor put on pt, v/s stable, call light within pt's reach, will continue to monitor. Obasogie-Asidi, Enzley Kitchens Efe

## 2014-11-17 NOTE — Progress Notes (Addendum)
Occupational Therapy Evaluation Patient Details Name: Eugene Dudley R Bump MRN: [id number] DOB: 02/18/1941 Today's Date: 11/17/2014    History of Present Illness pt presents with speech deficits. MRI brain: Subcentimeter area of acute infarction, left lateral and posterior temporal and occipital region MRI brain: Subcentimeter area of acute infarction, left lateral and posterior temporal and occipital region   Clinical Impression   PTA, pt lived alone and was independent with ADL and mobility. Pt appears close to baseline level of function. Visual fields appear intact. Sister in law states she can provide S initially after D/C home. Discussed home safety and recommendations for safe D/C home. Pt ready to D/C home from OT standpoint with intermittent S when medically stable. OT signing off.  Educated pt/family on signs/symptoms of CVA using "BeFast". Pt verbalized understanding.    Follow Up Recommendations  Supervision - Intermittent    Equipment Recommendations  None recommended by OT    Recommendations for Other Services       Precautions / Restrictions Precautions Precautions: None Restrictions Weight Bearing Restrictions: No      Mobility Bed Mobility Overal bed mobility: Independent                Transfers Overall transfer level: Independent                    Balance Overall balance assessment: No apparent balance deficits (not formally assessed)                                          ADL Overall ADL's : At baseline                                Discussed recommendation of initially providing S for IADL taks in order to further assess safety within the home. If family has any safety concerns, recommended they discuss these concerns with the MD. Also recommended family only S during tasks such as medication management, cooking, etc. Family verbalized understanding.             Vision Vision Assessment?: Yes Eye  Alignment: Within Functional Limits Ocular Range of Motion: Within Functional Limits Alignment/Gaze Preference: Within Defined Limits Tracking/Visual Pursuits: Able to track stimulus in all quads without difficulty Saccades: Within functional limits Convergence: Within functional limits Visual Fields: No apparent deficits   Perception     Praxis Praxis Praxis tested?: Within functional limits    Pertinent Vitals/Pain Pain Assessment: No/denies pain     Hand Dominance Right   Extremity/Trunk Assessment Upper Extremity Assessment Upper Extremity Assessment: Overall WFL for tasks assessed   Lower Extremity Assessment Lower Extremity Assessment: Overall WFL for tasks assessed   Cervical / Trunk Assessment Cervical / Trunk Assessment: Normal   Communication Communication Communication: Expressive difficulties   Cognition Arousal/Alertness: Awake/alert Behavior During Therapy: WFL for tasks assessed/performed Overall Cognitive Status: Impaired/Different from baseline Area of Impairment: Memory;Awareness - repeated self several times     Memory: Decreased short-term memory  Sister in law states he appears close to baseline regarding his cognition   Awareness: Emergent                          Home Living Family/patient expects to be discharged to:: Private residence Living Arrangements: Alone Available Help at Discharge: Family;Friend(s);Available PRN/intermittently Type  of Home: House Home Access: Stairs to enter Entergy CorporationEntrance Stairs-Number of Steps: 3 Entrance Stairs-Rails: Right Home Layout: One level     Bathroom Shower/Tub: Tub/shower unit Shower/tub characteristics: Curtain FirefighterBathroom Toilet: Handicapped height Bathroom Accessibility: Yes How Accessible: Accessible via walker Home Equipment: Grab bars - tub/shower;Shower seat          Prior Functioning/Environment Level of Independence: Independent        Comments: drives    OT Diagnosis:  Cognitive deficits   OT Problem List: Decreased cognition   OT Treatment/Interventions:      OT Goals(Current goals can be found in the care plan section) Acute Rehab OT Goals Patient Stated Goal: Home OT Goal Formulation: All assessment and education complete, DC therapy   End of Session Nurse Communication: Mobility status  Activity Tolerance: Patient tolerated treatment well Patient left: in bed;with call bell/phone within reach;with family/visitor present   Time: [id number] OT Time Calculation (min): 25 min Charges:  OT General Charges $OT Visit: 1 Procedure OT Evaluation $Initial OT Evaluation Tier I: 1 Procedure OT Treatments $Self Care/Home Management : 8-22 mins G-Codes:    Kamuela Magos,HILLARY 11/17/2014, 4:53 PM   [facility] Haedyn Ancrum, OTR/L  [phone] 11/17/2014

## 2014-11-17 NOTE — Progress Notes (Signed)
[facility]                                                                              Patient Demographics  Eugene Dudley, is a 74 y.o. male, DOB - 04/01/1941, ZOX:[id number]  Admit date - 11/16/2014   Admitting Physician Houston SirenPeter Le, MD  Outpatient Primary MD for the patient is PROVIDER NOT IN SYSTEM  LOS -    Chief Complaint  Patient presents with  . Aphasia      Interim history  74 year old with history of hypertension, diabetes, TIA and CVA over 10 years ago presented with complaints of slurred speech. Patient found to have acute CVA. Currently pending workup. Neurology is consulted and following, recommending possible 30 day event monitor with two-month follow-up with Dr. Roda ShuttersXu  Assessment & Plan  Acute CVA -CT head: No acute intracranial abnormalities -MRI brain: Subcentimeter area of acute infarction, left lateral and posterior temporal and occipital region -Echocardiogram: Pending -Carotid doppler: Pending -LDL 25 -hemoglobin A1c pending -PT/OT/speech consulted pending -Neurology consulted and appreciated, recommended 30 day event monitor, follow up in 2 months with Dr. Roda ShuttersXu. -Continue aspirin and statin  Diabetes mellitus, type II -Hemoglobin A1c pending -Continue to complain scale CBG monitoring -Metformin held  Hypertension -Allow for permissive hypertension -Lisinopril held  Hyperlipidemia -Continue statin  Depression/anxiety -Continue Paxil and Klonopin  Code Status: Full  Family Communication: None at bedside  Disposition Plan: Admitted  Time Spent in minutes   30 minutes  Procedures  Echocardiogram Carotid Doppler  Consults   Neurology  DVT Prophylaxis  Heparin  Lab Results  Component Value Date   PLT 136* 11/16/2014    Medications  Scheduled Meds: . alfuzosin  10 mg Oral Daily  . aspirin  325 mg Oral Daily  . cholecalciferol  1,000 Units Oral Daily  . clonazePAM  0.5 mg Oral QID  . heparin  5,000 Units Subcutaneous 3 times per  day  . insulin aspart  0-5 Units Subcutaneous QHS  . insulin aspart  0-9 Units Subcutaneous TID WC  . pantoprazole  40 mg Oral BID  . PARoxetine  60 mg Oral Daily  . simvastatin  20 mg Oral Daily   Continuous Infusions: . sodium chloride 75 mL/hr at 11/17/14 0137   PRN Meds:.acetaminophen  Antibiotics    Anti-infectives    None      Subjective:   Eugene Dudley seen and examined today.  Patient states he's feeling much better. He finds that his speech has actually improved. Patient currently denies any chest pain, shortness of breath, dizziness, headache, visual changes, abdominal pain. Patient inquires about going home.  Objective:   Filed Vitals:   11/17/14 0123 11/17/14 0200 11/17/14 0400 11/17/14 0600  BP:  118/70 131/64 128/78  Pulse:  74 67 65  Temp:    98.6 F (37 C)  TempSrc:    Oral  Resp:  18 18 20   Height:      Weight: 110.337 kg (243 lb 4 oz)     SpO2:  97% 97% 97%    Wt Readings from Last 3 Encounters:  11/17/14 110.337 kg (243 lb 4 oz)     Intake/Output Summary (Last 24  hours) at 11/17/14 1427 Last data filed at 11/17/14 0900  Gross per 24 hour  Intake    360 ml  Output    625 ml  Net   -265 ml    Exam  General: Well developed, well nourished, NAD, appears stated age  HEENT: NCAT, PERRLA, EOMI, Anicteic Sclera, mucous membranes moist.   Cardiovascular: S1 S2 auscultated, no rubs, murmurs or gallops. Regular rate and rhythm.  Respiratory: Clear to auscultation bilaterally with equal chest rise  Abdomen: Soft, obese, +umbilical hernia, nontender, nondistended, + bowel sounds  Extremities: warm dry without cyanosis clubbing or edema  Neuro: AAOx3, cranial nerves grossly intact. Strength 5/5 in patient's upper and lower extremities bilaterally  Psych: Normal affect and demeanor   Data Review   Micro Results No results found for this or any previous visit (from the past 240 hour(s)).  Radiology Reports Ct Head Wo Contrast  11/16/2014    CLINICAL DATA:  Woke up with headache.  aphasia.  EXAM: CT HEAD WITHOUT CONTRAST  TECHNIQUE: Contiguous axial images were obtained from the base of the skull through the vertex without intravenous contrast.  COMPARISON:  None.  FINDINGS: Prominence of the sulci and ventricles are identified consistent with brain atrophy. Small, chronic appearing bilateral lacunar infarcts noted within the basal ganglia. No acute cortical infarct, hemorrhage, or mass lesion ispresent. Ventricles are of normal size. No significant extra-axial fluid collection is present. Mucosal thickening involving the anterior ethmoid air cells noted. There is also mild mucosal thickening involving the frontal sinus. The paranasal sinuses andmastoid air cells are clear. The osseous skull is intact.  IMPRESSION: 1. No acute intracranial abnormalities. 2. Atrophy and chronic microvascular disease noted.   Electronically Signed   By: Signa Kellaylor  Stroud M.D.   On: 11/16/2014 18:47   [name] (neuro Protocol)  11/16/2014   CLINICAL DATA:  Patient woke up with a headache. Later, the patient developed aphasia. Stroke risk factors include diabetes and hypertension.  EXAM: MRI HEAD WITHOUT CONTRAST  TECHNIQUE: Multiplanar, multiecho pulse sequences of the brain and surrounding structures were obtained without intravenous contrast.  COMPARISON:  CT head earlier today.  No prior imaging.  FINDINGS: Small focus of restricted diffusion in the LEFT lateral posterior temporal occipital region, 5 mm diameter, involving the cortex, consistent with acute infarction. No areas of restricted diffusion are seen elsewhere, specifically none within Broca's area. The small area of infarction probably corresponds to LEFT MCA territory, although its location approaches the watershed zone between LEFT MCA and LEFT PCA.  No evidence for acute hemorrhage, mass lesion, hydrocephalus, or extra-axial fluid. Moderately advanced cerebral and cerebellar atrophy. Mild to  moderate T2 and FLAIR hyperintensities throughout periventricular and subcortical white matter likely representing small vessel disease.  Pituitary, pineal, and cerebellar tonsils unremarkable. No upper cervical lesions. Flow voids are maintained throughout the carotid, basilar, and vertebral arteries.  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 Prominent perivascular spaces are noted.  Visualized calvarium, skull base, and upper cervical osseous structures unremarkable. Scalp and extracranial soft tissues, orbits, sinuses, and mastoids show no acute process.  IMPRESSION: Subcentimeter area of acute infarction affects the LEFT lateral and posterior temporal occipital region. No associated hemorrhage.  Moderately Advanced atrophy and small vessel disease.   Electronically Signed   By: Davonna BellingJohn  Curnes M.D.   On: 11/16/2014 20:46   [name] Head/brain Wo Cm  11/17/2014   CLINICAL DATA:  Left temporal lobe infarct.  Acute CVA.  EXAM: MRA HEAD WITHOUT CONTRAST  TECHNIQUE: Angiographic images of the Circle of Willis were obtained using MRA technique without intravenous contrast.  COMPARISON:  MRI brain 11/16/2014  FINDINGS: The internal carotid arteries are within normal limits from the high cervical segments through the ICA termini bilaterally. The A1 and M1 segments are normal. Anterior communicating artery is patent. The MCA bifurcations are within normal limits on bilaterally. There is moderate attenuation of distal MCA branch vessels bilaterally, worse on the right. No significant proximal stenosis or occlusion is present.  The vertebral arteries are codominant. The left PICA origin is visualized and normal. Prominent AICA vessels are noted bilaterally. The basilar artery is within normal limits. Both posterior cerebral arteries originate from the basilar tip. There is moderate attenuation of distal PCA branch  vessels bilaterally.  IMPRESSION: 1. Moderate distal small vessel disease bilaterally, slightly more prominent in the right MCA distribution. 2. No significant proximal stenosis, aneurysm, or branch vessel occlusion.   Electronically Signed   By: Marin Roberts M.D.   On: 11/17/2014 09:08    CBC  Recent Labs Lab 11/16/14 1743  WBC 7.7  HGB 14.8  HCT 43.9  PLT 136*  MCV 94.8  MCH 32.0  MCHC 33.7  RDW 14.2  LYMPHSABS 1.7  MONOABS 0.7  EOSABS 0.0  BASOSABS 0.0    Chemistries   Recent Labs Lab 11/16/14 1743  NA 136  K 4.0  CL 102  CO2 25  GLUCOSE 143*  BUN 19  CREATININE 1.06  CALCIUM 9.7  AST 23  ALT 13*  ALKPHOS 49  BILITOT 1.0   ------------------------------------------------------------------------------------------------------------------ estimated creatinine clearance is 74.9 mL/min (by C-G formula based on Cr of 1.06). ------------------------------------------------------------------------------------------------------------------ No results for input(s): HGBA1C in the last 72 hours. ------------------------------------------------------------------------------------------------------------------  Recent Labs  11/17/14 0540  CHOL 92  HDL 35*  LDLCALC 25  TRIG 161*  CHOLHDL 2.6   ------------------------------------------------------------------------------------------------------------------ No results for input(s): TSH, T4TOTAL, T3FREE, THYROIDAB in the last 72 hours.  Invalid input(s): FREET3 ------------------------------------------------------------------------------------------------------------------ No results for input(s): VITAMINB12, FOLATE, FERRITIN, TIBC, IRON, RETICCTPCT in the last 72 hours.  Coagulation profile  Recent Labs Lab 11/16/14 1743  INR 1.05    No results for input(s): DDIMER in the last 72 hours.  Cardiac Enzymes No results for input(s): CKMB, TROPONINI, MYOGLOBIN in the last 168 hours.  Invalid input(s):  CK ------------------------------------------------------------------------------------------------------------------ Invalid input(s): POCBNP    Lemont Sitzmann D.O. on 11/17/2014 at 2:27 PM  Between 7am to 7pm - Pager - [phone]  After 7pm go to [email or web address] - password [email or web address]  And look for the night coverage person covering for me after hours  [facility] Office  [phone]

## 2014-11-17 NOTE — Evaluation (Signed)
Speech Language Pathology Evaluation Patient Details Name: Eugene Dudley MRN: [id number] DOB: 09/18/1940 Today's Date: 11/17/2014 Time: [id number] SLP Time Calculation (min) (ACUTE ONLY): 31 min  Problem List:  Patient Active Problem List   Diagnosis Date Noted  . Acute CVA (cerebrovascular accident) 11/16/2014  . HTN (hypertension), benign 11/16/2014  . Hyperlipidemia 11/16/2014  . DM (diabetes mellitus) 11/16/2014   Past Medical History:  Past Medical History  Diagnosis Date  . Diabetes mellitus without complication   . Hypertension   . TIA (transient ischemic attack) 2011  . Hypercholesterolemia    Past Surgical History:  Past Surgical History  Procedure Laterality Date  . Hernia repair    . Tonsillectomy     HPI:  pt presents with speech deficits and found to have L Temporal Infarct. pt with hx of TIA and CVA.    Assessment / Plan / Recommendation Clinical Impression  Pt has mild expressive language impairments, characterized by word-finding difficulties during conversational speech and mildly complex linguistic tasks. He scored a 22/30 on the MoCA (below 26 indicates impairment), although he does endorse some baseline memory difficulties. Pt believes that his symproms are improving but have not yet returned to baseline. He would benefit from f/u OP SLP services should symptoms persist upon discharge.     SLP Assessment  All further Speech Lanaguage Pathology  needs can be addressed in the next venue of care    Follow Up Recommendations  Outpatient SLP;Other (comment) (intermittent supervision)    Pertinent Vitals/Pain Pain Assessment: No/denies pain   SLP Goals  Patient/Family Stated Goal: none stated  SLP Evaluation Prior Functioning  Cognitive/Linguistic Baseline: Baseline deficits Baseline deficit details: pt describes baseline difficulties with memory Type of Home: House   Cognition  Overall Cognitive Status: Impaired/Different from  baseline Arousal/Alertness: Awake/alert Orientation Level: Oriented X4 Attention: Selective Selective Attention: Appears intact Memory: Impaired Memory Impairment: Retrieval deficit;Decreased recall of new information Awareness: Appears intact Problem Solving: Appears intact Executive Function: Reasoning Reasoning: Impaired Reasoning Impairment: Verbal basic Safety/Judgment: Appears intact    Comprehension  Auditory Comprehension Overall Auditory Comprehension: Appears within functional limits for tasks assessed Visual Recognition/Discrimination Discrimination: Within Function Limits    Expression Expression Primary Mode of Expression: Verbal Verbal Expression Overall Verbal Expression: Impaired Initiation: No impairment Level of Generative/Spontaneous Verbalization: Conversation Repetition: No impairment Naming: Impairment Confrontation: Within functional limits (self-corrected with Mod I) Other Naming Comments: word-finding difficulties in conversation and during linguistic tasks Verbal Errors: Aware of errors Pragmatics: No impairment Non-Verbal Means of Communication: Not applicable   Oral / Motor Oral Motor/Sensory Function Overall Oral Motor/Sensory Function: Appears within functional limits for tasks assessed Motor Speech Overall Motor Speech: Appears within functional limits for tasks assessed   GO Functional Assessment Tool Used: skilled clinical judgment Functional Limitations: Spoken language expressive Spoken Language Expression Current Status [phone](G9162): At least 1 percent but less than 20 percent impaired, limited or restricted Spoken Language Expression Goal Status [phone](G9163): At least 1 percent but less than 20 percent impaired, limited or restricted Spoken Language Expression Discharge Status [phone](G9164): At least 1 percent but less than 20 percent impaired, limited or restricted    Maxcine HamLaura Paiewonsky, M.A. CCC-SLP [phone]  Maxcine Hamaiewonsky, Lendell Gallick 11/17/2014, 2:30  PM

## 2014-11-17 NOTE — Progress Notes (Signed)
  Echocardiogram 2D Echocardiogram with Definity has been performed.  Cathie BeamsGREGORY, Brendan Gruwell 11/17/2014, 12:42 PM

## 2014-11-17 NOTE — Progress Notes (Signed)
STROKE TEAM PROGRESS NOTE   SUBJECTIVE (INTERVAL HISTORY) His sisters are at the bedside. Overall he feels his condition is completely resolved. He admits that he ate 6-8 sandwiches with a lot of salt and tomatoes the night before his HA started. He never ate like that before. Around 2am Monday morning, he had worst HA in his life. At that same time, he speech did not make sense and he could not read. These lasted until he went to ER at 5pm. Since then, his symptoms gradually better and completely resolved this am.    OBJECTIVE Temp:  [98.5 F (36.9 C)-98.8 F (37.1 C)] 98.6 F (37 C) (05/10 0600) Pulse Rate:  [65-106] 65 (05/10 0600) Cardiac Rhythm:  [-] Normal sinus rhythm (05/10 0759) Resp:  [14-20] 20 (05/10 0600) BP: (93-132)/(44-78) 128/78 mmHg (05/10 0600) SpO2:  [93 %-100 %] 97 % (05/10 0600) Weight:  [110.337 kg (243 lb 4 oz)] 110.337 kg (243 lb 4 oz) (05/10 0123)   Recent Labs Lab 11/17/14 0035 11/17/14 0634  GLUCAP 105* 93    Recent Labs Lab 11/16/14 1743  NA 136  K 4.0  CL 102  CO2 25  GLUCOSE 143*  BUN 19  CREATININE 1.06  CALCIUM 9.7    Recent Labs Lab 11/16/14 1743  AST 23  ALT 13*  ALKPHOS 49  BILITOT 1.0  PROT 6.7  ALBUMIN 3.6    Recent Labs Lab 11/16/14 1743  WBC 7.7  NEUTROABS 5.2  HGB 14.8  HCT 43.9  MCV 94.8  PLT 136*   No results for input(s): CKTOTAL, CKMB, CKMBINDEX, TROPONINI in the last 168 hours.  Recent Labs  11/16/14 1743  LABPROT 13.8  INR 1.05   No results for input(s): COLORURINE, LABSPEC, PHURINE, GLUCOSEU, HGBUR, BILIRUBINUR, KETONESUR, PROTEINUR, UROBILINOGEN, NITRITE, LEUKOCYTESUR in the last 72 hours.  Invalid input(s): APPERANCEUR     Component Value Date/Time   CHOL 92 11/17/2014 0540   TRIG 160* 11/17/2014 0540   HDL 35* 11/17/2014 0540   CHOLHDL 2.6 11/17/2014 0540   VLDL 32 11/17/2014 0540   LDLCALC 25 11/17/2014 0540   No results found for: HGBA1C    Component Value Date/Time   LABOPIA NONE  DETECTED 11/17/2014 0639   COCAINSCRNUR NONE DETECTED 11/17/2014 0639   LABBENZ NONE DETECTED 11/17/2014 0639   AMPHETMU NONE DETECTED 11/17/2014 0639   THCU NONE DETECTED 11/17/2014 0639   LABBARB NONE DETECTED 11/17/2014 0639    No results for input(s): ETH in the last 168 hours.  Ct Head Wo Contrast  11/16/2014    IMPRESSION: 1. No acute intracranial abnormalities. 2. Atrophy and chronic microvascular disease noted.     Eugene Dudley Wo Contrast (neuro Protocol)  11/16/2014   IMPRESSION: Subcentimeter area of acute infarction affects the LEFT lateral and posterior temporal occipital region. No associated hemorrhage.  Moderately Advanced atrophy and small vessel disease.    Eugene Dudley Head/Dudley Wo Cm  11/17/2014    IMPRESSION: 1. Moderate distal small vessel disease bilaterally, slightly more prominent in the right MCA distribution. 2. No significant proximal stenosis, aneurysm, or branch vessel occlusion.      2D echo pending  LE venous doppler pending  2D echo pending  PHYSICAL EXAM  Temp:  [97.6 F (36.4 C)-98.8 F (37.1 C)] 97.6 F (36.4 C) (05/10 1454) Pulse Rate:  [65-106] 80 (05/10 1454) Resp:  [14-20] 20 (05/10 1454) BP: (93-132)/(44-78) 120/74 mmHg (05/10 1454) SpO2:  [92 %-100 %] 92 % (05/10 1454) Weight:  [243 lb 4  oz (110.337 kg)] 243 lb 4 oz (110.337 kg) (05/10 0123)  General - Well nourished, well developed, in no apparent distress.  Ophthalmologic - Sharp disc margins OU.  Cardiovascular - Regular rate and rhythm with no murmur.  Mental Status -  Level of arousal and orientation to time, place, and person were intact. Language including expression, naming, repetition, comprehension was assessed and found intact. Attention span and concentration were normal. Recent and remote memory were intact. Fund of Knowledge was assessed and was intact.  Cranial Nerves II - XII - II - Visual field intact OU. III, IV, VI - Extraocular movements intact. V - Facial  sensation intact bilaterally. VII - Facial movement intact bilaterally. VIII - Hearing & vestibular intact bilaterally. X - Palate elevates symmetrically. XI - Chin turning & shoulder shrug intact bilaterally. XII - Tongue protrusion intact.  Motor Strength - The patient's strength was normal in all extremities and pronator drift was absent.  Bulk was normal and fasciculations were absent.   Motor Tone - Muscle tone was assessed at the neck and appendages and was normal.  Reflexes - The patient's reflexes were 1+ in all extremities and he had no pathological reflexes.  Sensory - Light touch, temperature/pinprick, vibration and proprioception, and Romberg testing were assessed and were symmetrical.    Coordination - The patient had normal movements in the hands and feet with no ataxia or dysmetria.  Tremor was absent.  Gait and Station - The patient's transfers, posture, gait, station, and turns were observed as normal.   ASSESSMENT/PLAN Eugene. Eugene Dudley is a 74 y.o. male with history of HTN, DM2, prior TIA and CVA over 10 years ago, hx of HLD on statin presenting with slurred speech in setting of headache. He did not receive IV t-PA due to .   Headache with resultant encephalopathy  Resolved  May related to his binge eating the night PTA  No hx of migraine  Tylenol PRN  Avoid binge eating  Stroke, incidental finding:  Dominant left MCA/PCA punctate infarct, etiology unclear, embolic vs. small vessel disease source.  Resultant  No deficit  MRI  As above  MRA  unremarkable  Carotid Doppler  pending  2D Echo  Pending  Venous doppler pending  Due to punctate cortical infarct, recommend 30 day outpt cardiac event monitoring.  LDL 25  HgbA1c pending  Heparin 5000 units sq tid for VTE prophylaxis  Diet heart healthy/carb modified Room service appropriate?: Yes; Fluid consistency:: Thin  no antithrombotic prior to admission, now on aspirin 325 mg orally every day.  Continue ASA on discharge.  Patient counseled to be compliant with his antithrombotic medications  Ongoing aggressive stroke risk factor management  Hypertension  Home meds:   lisinopril  Stable  Patient counseled to be compliant with his blood pressure medications  Hyperlipidemia  Home meds:  zocor 40 mg resumed in hospital  LDL 25, at goal < 70  Continue statin at discharge  Diabetes  HgbA1c pending, goal < 7.0  Controlled  Other Stroke Risk Factors  Advanced age  Obesity, Body mass index is 34.9 kg/(m^2).   Hx stroke/TIA - TIA  Other Active Problems  Depression on paxil  Other Pertinent History    Hospital day # 1  Marvel PlanJindong Chanetta Moosman, MD PhD Stroke Neurology 11/17/2014 4:01 PM      To contact Stroke Continuity provider, please refer to [email or web address]. After hours, contact General Neurology

## 2014-11-18 ENCOUNTER — Inpatient Hospital Stay (HOSPITAL_COMMUNITY): Payer: Medicare Other

## 2014-11-18 DIAGNOSIS — M7989 Other specified soft tissue disorders: Secondary | ICD-10-CM

## 2014-11-18 DIAGNOSIS — I82503 Chronic embolism and thrombosis of unspecified deep veins of lower extremity, bilateral: Secondary | ICD-10-CM

## 2014-11-18 LAB — HEMOGLOBIN A1C
Hgb A1c MFr Bld: 6.3 % — ABNORMAL HIGH (ref 4.8–5.6)
Mean Plasma Glucose: 134 mg/dL

## 2014-11-18 LAB — GLUCOSE, CAPILLARY
GLUCOSE-CAPILLARY: 123 mg/dL — AB (ref 70–99)
GLUCOSE-CAPILLARY: 129 mg/dL — AB (ref 70–99)
Glucose-Capillary: 98 mg/dL (ref 70–99)

## 2014-11-18 MED ORDER — ASPIRIN 325 MG PO TABS: 325.0000 mg | ORAL_TABLET | Freq: Every day | ORAL | Status: DC

## 2014-11-18 NOTE — Discharge Summary (Signed)
Physician Discharge Summary  Eugene Dudley ZOX:[id number] DOB: 01/28/1941 DOA: 11/16/2014  PCP: PROVIDER NOT IN SYSTEM  Admit date: 11/16/2014 Discharge date: 11/18/2014  Time spent: 35 minutes  Recommendations for Outpatient Follow-up:  Needs to follow up with neurology after stroke.   Discharge Diagnoses:    Acute CVA (cerebrovascular accident)   HTN (hypertension), benign   Hyperlipidemia   DM (diabetes mellitus)   Discharge Condition: stable.   Diet recommendation: Heart Healthy  Filed Weights   11/17/14 0123  Weight: 110.337 kg (243 lb 4 oz)    History of present illness:  Eugene Dudley is an 74 y.o. male with hx of HTN, DM2, prior TIA and CVA over 10 years ago, hx of HLD on statin, presented to the ER with slurred speech. He did not have any focal weakness, and in the ER, his speech was improving. He was not a candidate for TPA as his symptoms were mild and he improved. Work up in the ER included a head CT which showed no acute process, and an MRI showed acute left lateral and posterior temporal and occipital CVA, small. He denied facial droop, focal weakness, or visual changes. He was seen in consultation with neurohospitalist, who recommended that he be admitted for stroke work up. He has been taking ASA, and in the past, had taken Plavix, but he " didn't like how it made me feel". Hospitalist was asked to admit him for same.   Hospital Course:   Interim history  74 year old with history of hypertension, diabetes, TIA and CVA over 10 years ago presented with complaints of slurred speech. Patient found to have acute CVA. Currently pending workup. Neurology is consulted and following, recommending possible 30 day event monitor with two-month follow-up with Dr. Roda ShuttersXu  Assessment & Plan  Acute CVA -CT head: No acute intracranial abnormalities -MRI brain: Subcentimeter area of acute infarction, left lateral and posterior temporal and occipital region -Echocardiogram: Ef  65 % -Carotid doppler: No evidence of significant stenosis.  -LDL 25 -hemoglobin A1c pending -PT/OT/speech consulted pending -Neurology consulted and appreciated, recommended 30 day event monitor, follow up in 2 months with Dr. Roda ShuttersXu. -Continue aspirin and statin  Diabetes mellitus, type II -Hemoglobin A1c pending -Continue to complain scale CBG monitoring -Metformin, resume at discharge  Hypertension -Allow for permissive hypertension -Lisinopril resume at discharge  Hyperlipidemia -Continue statin  Depression/anxiety -Continue Paxil and Klonopin     LE chronic DVT; incidental finding. Need repeat doppler for follow up. Patient was asymptomatic.    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   Consultations:  neurology  Discharge Exam: Filed Vitals:   11/18/14 1759  BP: 113/67  Pulse: 72  Temp: 97.9 F (36.6 C)  Resp: 18    General: Alert in no distress.  Cardiovascular: S 1, S 2 RRR Respiratory: CTA  Discharge Instructions   Discharge Instructions    Ambulatory referral to Neurology    Complete by:  As directed   Dr. Roda ShuttersXu requests followup in 2 months     Diet - low sodium heart healthy    Complete by:  As directed      Increase activity slowly    Complete by:  As directed           Discharge Medication List as of 11/18/2014  6:07 PM    CONTINUE these medications which have CHANGED   Details  aspirin 325 MG tablet  Take 1 tablet (325 mg total) by mouth daily., Starting 11/18/2014, Until Discontinued, Print      CONTINUE these medications which have NOT CHANGED   Details  alfuzosin (UROXATRAL) 10 MG 24 hr tablet Take 10 mg by mouth at bedtime. , Until Discontinued, Historical Med    Cholecalciferol 1000 UNITS capsule Take 3,000 Units by mouth at bedtime. , Until Discontinued, Historical Med    clonazePAM (KLONOPIN) 0.5 MG tablet Take 0.5 mg by mouth 4 (four) times daily., Until  Discontinued, Historical Med    lisinopril (PRINIVIL,ZESTRIL) 10 MG tablet Take 5 mg by mouth at bedtime., Until Discontinued, Historical Med    metFORMIN (GLUCOPHAGE) 500 MG tablet Take 500 mg by mouth 2 (two) times daily., Until Discontinued, Historical Med    Omega-3 Fatty Acids (FISH OIL) 1200 MG CAPS Take 3,600 mg by mouth at bedtime., Until Discontinued, Historical Med    pantoprazole (PROTONIX) 40 MG tablet Take 40 mg by mouth 2 (two) times daily., Until Discontinued, Historical Med    PARoxetine (PAXIL) 20 MG tablet Take 60 mg by mouth at bedtime. , Until Discontinued, Historical Med    simvastatin (ZOCOR) 40 MG tablet Take 20 mg by mouth at bedtime. , Until Discontinued, Historical Med       No Known Allergies Follow-up Information    Follow up with SETHI,PRAMOD, MD In 2 months.   Specialties:  Neurology, Radiology   Why:  Stroke Clinic, Office will call you with appointment date & time   Contact information:   [address] [place] [address] [phone]       Please follow up.   Why:  needs to follow up with PCP       The results of significant diagnostics from this hospitalization (including imaging, microbiology, ancillary and laboratory) are listed below for reference.    Significant Diagnostic Studies: Ct Head Wo Contrast  11/16/2014   CLINICAL DATA:  Woke up with headache.  aphasia.  EXAM: CT HEAD WITHOUT CONTRAST  TECHNIQUE: Contiguous axial images were obtained from the base of the skull through the vertex without intravenous contrast.  COMPARISON:  None.  FINDINGS: Prominence of the sulci and ventricles are identified consistent with brain atrophy. Small, chronic appearing bilateral lacunar infarcts noted within the basal ganglia. No acute cortical infarct, hemorrhage, or mass lesion ispresent. Ventricles are of normal size. No significant extra-axial fluid collection is present. Mucosal thickening involving the anterior ethmoid air cells noted.  There is also mild mucosal thickening involving the frontal sinus. The paranasal sinuses andmastoid air cells are clear. The osseous skull is intact.  IMPRESSION: 1. No acute intracranial abnormalities. 2. Atrophy and chronic microvascular disease noted.   Electronically Signed   By: Signa Kellaylor  Stroud M.D.   On: 11/16/2014 18:47   [name] (neuro Protocol)  11/16/2014   CLINICAL DATA:  Patient woke up with a headache. Later, the patient developed aphasia. Stroke risk factors include diabetes and hypertension.  EXAM: MRI HEAD WITHOUT CONTRAST  TECHNIQUE: Multiplanar, multiecho pulse sequences of the brain and surrounding structures were obtained without intravenous contrast.  COMPARISON:  CT head earlier today.  No prior imaging.  FINDINGS: Small focus of restricted diffusion in the LEFT lateral posterior temporal occipital region, 5 mm diameter, involving the cortex, consistent with acute infarction. No areas of restricted diffusion are seen elsewhere, specifically none within Broca's area. The small area of infarction probably corresponds to LEFT MCA territory, although its location approaches the watershed zone between  LEFT MCA and LEFT PCA.  No evidence for acute hemorrhage, mass lesion, hydrocephalus, or extra-axial fluid. Moderately advanced cerebral and cerebellar atrophy. Mild to moderate T2 and FLAIR hyperintensities throughout periventricular and subcortical white matter likely representing small vessel disease.  Pituitary, pineal, and cerebellar tonsils unremarkable. No upper cervical lesions. Flow voids are maintained throughout the carotid, basilar, and vertebral arteries.  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 Prominent perivascular spaces are noted.  Visualized calvarium, skull base, and upper cervical osseous structures unremarkable. Scalp and extracranial soft tissues, orbits,  sinuses, and mastoids show no acute process.  IMPRESSION: Subcentimeter area of acute infarction affects the LEFT lateral and posterior temporal occipital region. No associated hemorrhage.  Moderately Advanced atrophy and small vessel disease.   Electronically Signed   By: Davonna Belling M.D.   On: 11/16/2014 20:46   Mr Maxine Glenn Head/brain Wo Cm  11/17/2014   CLINICAL DATA:  Left temporal lobe infarct.  Acute CVA.  EXAM: MRA HEAD WITHOUT CONTRAST  TECHNIQUE: Angiographic images of the Circle of Willis were obtained using MRA technique without intravenous contrast.  COMPARISON:  MRI brain 11/16/2014  FINDINGS: The internal carotid arteries are within normal limits from the high cervical segments through the ICA termini bilaterally. The A1 and M1 segments are normal. Anterior communicating artery is patent. The MCA bifurcations are within normal limits on bilaterally. There is moderate attenuation of distal MCA branch vessels bilaterally, worse on the right. No significant proximal stenosis or occlusion is present.  The vertebral arteries are codominant. The left PICA origin is visualized and normal. Prominent AICA vessels are noted bilaterally. The basilar artery is within normal limits. Both posterior cerebral arteries originate from the basilar tip. There is moderate attenuation of distal PCA branch vessels bilaterally.  IMPRESSION: 1. Moderate distal small vessel disease bilaterally, slightly more prominent in the right MCA distribution. 2. No significant proximal stenosis, aneurysm, or branch vessel occlusion.   Electronically Signed   By: Marin Roberts M.D.   On: 11/17/2014 09:08    Microbiology: No results found for this or any previous visit (from the past 240 hour(s)).   Labs: Basic Metabolic Panel:  Recent Labs Lab 11/16/14 1743  NA 136  K 4.0  CL 102  CO2 25  GLUCOSE 143*  BUN 19  CREATININE 1.06  CALCIUM 9.7   Liver Function Tests:  Recent Labs Lab 11/16/14 1743  AST 23  ALT  13*  ALKPHOS 49  BILITOT 1.0  PROT 6.7  ALBUMIN 3.6   No results for input(s): LIPASE, AMYLASE in the last 168 hours. No results for input(s): AMMONIA in the last 168 hours. CBC:  Recent Labs Lab 11/16/14 1743  WBC 7.7  NEUTROABS 5.2  HGB 14.8  HCT 43.9  MCV 94.8  PLT 136*   Cardiac Enzymes: No results for input(s): CKTOTAL, CKMB, CKMBINDEX, TROPONINI in the last 168 hours. BNP: BNP (last 3 results) No results for input(s): BNP in the last 8760 hours.  ProBNP (last 3 results) No results for input(s): PROBNP in the last 8760 hours.  CBG:  Recent Labs Lab 11/17/14 1647 11/17/14 2210 11/18/14 0634 11/18/14 1121 11/18/14 1607  GLUCAP 124* 105* 98 123* 129*       Signed:  Oluwatobi Visser A  [facility] 11/18/2014, 8:27 PM

## 2014-11-18 NOTE — Progress Notes (Signed)
Carotid Duplex Completed. No evidence of a significant stenosis in the right and left ICAs.  Eugene Dudley, BS, RDMS, RVT

## 2014-11-18 NOTE — Progress Notes (Signed)
Bilateral Venous Duplex Lower Ext. Completed. Preliminary results by tech - Positive for a mild amount of chronic appearing thrombus in the right popliteal vein and the left femoral vein.  Marilynne Halstedita Patty Lopezgarcia, BS, RDMS, RVT

## 2014-11-18 NOTE — Progress Notes (Signed)
STROKE TEAM PROGRESS NOTE   SUBJECTIVE (INTERVAL HISTORY) No family at the bedside. Patient lying in the bed. Patient does not like Plavix. aggreeable to decrease salt in his diet.   OBJECTIVE Temp:  [97.6 F (36.4 C)-98.2 F (36.8 C)] 98.2 F (36.8 C) (05/11 0918) Pulse Rate:  [68-80] 68 (05/11 0918) Cardiac Rhythm:  [-] Normal sinus rhythm (05/10 1937) Resp:  [18-20] 18 (05/11 0918) BP: (114-137)/(64-79) 122/64 mmHg (05/11 0918) SpO2:  [92 %-95 %] 94 % (05/11 0918)   Recent Labs Lab 11/17/14 0634 11/17/14 1113 11/17/14 1647 11/17/14 2210 11/18/14 0634  GLUCAP 93 106* 124* 105* 98    Recent Labs Lab 11/16/14 1743  NA 136  K 4.0  CL 102  CO2 25  GLUCOSE 143*  BUN 19  CREATININE 1.06  CALCIUM 9.7    Recent Labs Lab 11/16/14 1743  AST 23  ALT 13*  ALKPHOS 49  BILITOT 1.0  PROT 6.7  ALBUMIN 3.6    Recent Labs Lab 11/16/14 1743  WBC 7.7  NEUTROABS 5.2  HGB 14.8  HCT 43.9  MCV 94.8  PLT 136*   No results for input(s): CKTOTAL, CKMB, CKMBINDEX, TROPONINI in the last 168 hours.  Recent Labs  11/16/14 1743  LABPROT 13.8  INR 1.05   No results for input(s): COLORURINE, LABSPEC, PHURINE, GLUCOSEU, HGBUR, BILIRUBINUR, KETONESUR, PROTEINUR, UROBILINOGEN, NITRITE, LEUKOCYTESUR in the last 72 hours.  Invalid input(s): APPERANCEUR     Component Value Date/Time   CHOL 92 11/17/2014 0540   TRIG 160* 11/17/2014 0540   HDL 35* 11/17/2014 0540   CHOLHDL 2.6 11/17/2014 0540   VLDL 32 11/17/2014 0540   LDLCALC 25 11/17/2014 0540   Lab Results  Component Value Date   HGBA1C 6.3* 11/17/2014      Component Value Date/Time   LABOPIA NONE DETECTED 11/17/2014 0639   COCAINSCRNUR NONE DETECTED 11/17/2014 0639   LABBENZ NONE DETECTED 11/17/2014 0639   AMPHETMU NONE DETECTED 11/17/2014 0639   THCU NONE DETECTED 11/17/2014 0639   LABBARB NONE DETECTED 11/17/2014 0639    No results for input(s): ETH in the last 168 hours.   Ct Head Wo  Contrast  11/16/2014    IMPRESSION: 1. No acute intracranial abnormalities. 2. Atrophy and chronic microvascular disease noted.     [name] (neuro Protocol)  11/16/2014   IMPRESSION: Subcentimeter area of acute infarction affects the LEFT lateral and posterior temporal occipital region. No associated hemorrhage.  Moderately Advanced atrophy and small vessel disease.    Mr Maxine Glenn Head/brain Wo Cm  11/17/2014    IMPRESSION: 1. Moderate distal small vessel disease bilaterally, slightly more prominent in the right MCA distribution. 2. No significant proximal stenosis, aneurysm, or branch vessel occlusion.      2D echo  - Left ventricle: The cavity size was normal. There was mild focalbasal hypertrophy of the septum. Systolic function was normal.The estimated ejection fraction was in the range of 60% to 65%.Wall motion was normal; there were no regional wall motionabnormalities. Doppler parameters are consistent with abnormalleft ventricular relaxation (grade 1 diastolic dysfunction). TheE/e&' ratio is between 8-15, suggesting indetermiante LV fillingpressure. -  Aortic valve: Trileaflet. Sclerosis without stenosis. There wasno regurgitation. - Mitral valve: Calcified annulus. There was trivial regurgitation. - Left atrium: The atrium was at the upper limits of normal insize. - Right ventricle: The cavity size was normal. Wall thickness wasnormal. Systolic function was normal. - Right atrium: The atrium was normal in size. - Inferior vena cava: The vessel  was normal in size. Therespirophasic diameter changes were in the normal range (>= 50%),consistent with normal central venous pressure. Impressions: LVEF 60-65%, mild focal basal septal hypertrophy, normal LVfunction, grade 1 diastolic dysfunction, elevated LV fillingpressure.  LE venous doppler Positive for a mild amount of chronic appearing thrombus in the right popliteal vein and the left femoral vein.  Carotid Doppler  No  evidence of a significant stenosis in the right and left ICAs.      PHYSICAL EXAM Temp:  [97.6 F (36.4 C)-98.2 F (36.8 C)] 98.2 F (36.8 C) (05/11 0918) Pulse Rate:  [68-80] 68 (05/11 0918) Resp:  [18-20] 18 (05/11 0918) BP: (114-137)/(64-79) 122/64 mmHg (05/11 0918) SpO2:  [92 %-95 %] 94 % (05/11 0918)  General - Well nourished, well developed, in no apparent distress.  Ophthalmologic - Sharp disc margins OU.  Cardiovascular - Regular rate and rhythm with no murmur.  Mental Status -  Level of arousal and orientation to time, place, and person were intact. Language including expression, naming, repetition, comprehension was assessed and found intact. Attention span and concentration were normal. Recent and remote memory were intact. Fund of Knowledge was assessed and was intact.  Cranial Nerves II - XII - II - Visual field intact OU. III, IV, VI - Extraocular movements intact. V - Facial sensation intact bilaterally. VII - Facial movement intact bilaterally. VIII - Hearing & vestibular intact bilaterally. X - Palate elevates symmetrically. XI - Chin turning & shoulder shrug intact bilaterally. XII - Tongue protrusion intact.  Motor Strength - The patient's strength was normal in all extremities and pronator drift was absent.  Bulk was normal and fasciculations were absent.   Motor Tone - Muscle tone was assessed at the neck and appendages and was normal.  Reflexes - The patient's reflexes were 1+ in all extremities and he had no pathological reflexes.  Sensory - Light touch, temperature/pinprick, vibration and proprioception, and Romberg testing were assessed and were symmetrical.    Coordination - The patient had normal movements in the hands and feet with no ataxia or dysmetria.  Tremor was absent.  Gait and Station - The patient's transfers, posture, gait, station, and turns were observed as normal.   ASSESSMENT/PLAN [name] is a 74 y.o. male with  history of HTN, DM2, prior TIA and CVA over 10 years ago, hx of HLD on statin presenting with slurred speech in setting of headache. He did not receive IV t-PA due to .   Headache with resultant encephalopathy, resolved  Resolved  May related to his binge eating the night PTA  No hx of migraine  Tylenol PRN  Avoid binge eating and high salt intake  Stroke, incidental finding:  Dominant left MCA/PCA punctate infarct, etiology unclear, most likely. small vessel disease source, bu could be embolic, though no embolic source found.  Resultant  No deficit  MRI left MCA/PCA punctate infarct  MRA  unremarkable  Carotid Doppler  No significant stenosis   2D Echo  No source of embolus   Venous doppler bilateral chronic appearing thrombus. No treatment indicated from stroke standpoint. Do not think it is associated with current stroke. Do not recommend additional TEE or bubble study at this time. Dr. Roda ShuttersXu discuss with Dr. Sunnie Nielsenegalado. Awaiting final LE doppler report.   Due to punctate cortical infarct, recommend 30 day outpt cardiac event monitoring. (arranged yesterday per report)  LDL 25  HgbA1c 6.3  Heparin 5000 units sq tid for VTE prophylaxis Diet heart healthy/carb modified  Room service appropriate?: Yes; Fluid consistency:: Thin  no antithrombotic prior to admission, now on aspirin 325 mg orally every day. Continue ASA on discharge.  Patient counseled to be compliant with his antithrombotic medications  Ongoing aggressive stroke risk factor management  Therapy recommendations:  No OT, No PT, OP ST NOTHING FURTHER TO ADD FROM THE STROKE STANDPOINT Patient has a 10-15% risk of having another stroke over the next year, the highest risk is within 2 weeks of the most recent stroke/TIA (risk of having a stroke following a stroke or TIA is the same). Ongoing risk factor control by Primary Care Physician Stroke Service will sign off. Please call should any needs arise. Follow-up  [facility] with Dr. Marvel PlanJindong Jouri Dudley in 2 months, order placed.  Hypertension  Home meds:   lisinopril  Stable  Patient counseled to be compliant with his blood pressure medications  Hyperlipidemia  Home meds:  zocor 40 mg resumed in hospital  LDL 25, at goal < 70  Continue statin at discharge  Diabetes type II  HgbA1c 6.3, at goal < 7.0  Controlled  Other Stroke Risk Factors  Advanced age  Obesity, Body mass index is 34.9 kg/(m^2).   Hx stroke/TIA - TIA  Other Active Problems  Depression/anxiety on paxil  Hospital day # 1   Rhoderick MoodyBIBY,SHARON  [facility] See Amion for Pager information 11/18/2014 11:16 AM  I, the attending vascular neurologist, have personally obtained a history, examined the patient, evaluated laboratory data, individually viewed imaging studies and agree with radiology interpretations. I also discussed with Dr. Sunnie Nielsenegalado regarding his care plan. Together with the NP/PA, we formulated the assessment and plan of care which reflects our mutual decision.  I have made any additions or clarifications directly to the above note and agree with the findings and plan as currently documented.   74 yo M with PMH of HTN, DM, HLD was admitted for HA after binge eating. However, MRI showed punctate left MCA/PCA infarct, likely small vessel disease, but embolic not able to completely ruled out, recommend 30 day outpt cardiac monitoring. His venous doppler showed chronic DVT bilaterally, do not think it is associated with current punctate infarct. Continue ASA and statin. Follow up in clinic.  Neurology will sign off. Please call with questions. Pt [facility]. Thanks for the consult.  Eugene PlanJindong Goebel Hellums, MD PhD Stroke Neurology 11/18/2014 2:28 PM    To contact Stroke Continuity provider, please refer to [email or web address]. After hours, contact General Neurology

## 2014-11-18 NOTE — Care Management Note (Signed)
Case Management Note  Patient Details  Name: Eugene Dudley MRN: [id number] Date of Birth: 06/28/1941  Subjective/Objective:         ADMITTED WITH CVA           Action/Plan: CM FOLLOWING FOR DCP  Expected Discharge Date:    POSSIBLY 11/19/2014           Expected Discharge Plan:   HOME   Discharge planning Services   CM CONSULT   Status of Service:   IN PROGRESS  Additional Comments:  Reola MosherChandler, Sherria Riemann L, RN,BSN,MHA [phone] 11/18/2014, 10:02 AM

## 2014-11-23 ENCOUNTER — Telehealth: Payer: Self-pay | Admitting: Neurology

## 2014-11-23 ENCOUNTER — Other Ambulatory Visit: Payer: Self-pay | Admitting: *Deleted

## 2014-11-23 DIAGNOSIS — I639 Cerebral infarction, unspecified: Secondary | ICD-10-CM

## 2014-11-23 NOTE — Telephone Encounter (Signed)
ER stroke patient  is calling to set up f/up appt with Dr Roda ShuttersXu.  He insisted that he wants to see Dr. Roda ShuttersXu only for his 2 month f/up. Is there any way you can work the patient in as I did not see any available appts?  He was discharged on 11/18/14.  Please call.  Thanks!

## 2014-11-23 NOTE — Patient Outreach (Signed)
[facility] Care Management  11/23/2014  Eugene Dudley 05/23/1941 [id number]  Referral per Emmi-Stroke Transition:  Outreach call to patient to follow up after receiving red zone for scheduled follow up appointment.  Spoke with patient who verified that he does [name] [place] [place]  States he has already spoken with doctor's office and has sent hospital discharge summary via fax as they requested. States they will call him back after reviewing summary and give appointment for follow up. States he has also received follow up appointment with neurologist for February 03, 2015. States they will call back if earlier appointment becomes available.   Patient states he has not experienced any stroke symptoms since his discharge from hospital to home on 05/11. Patient was able to identify symptoms of trouble talking, vision problem, severe headache, inability to move arm or leg, numbness or tingling  and knows to call 911.   States he has family support from daughter in-law that lives nearby. States she calls or comes by daily since his discharge from hospital. Patient states he received discharge instructions and understands them. Received stroke education with discharge materials and does not feel the need for further written stroke information at this time. Review of medications done with patient. Voicing no problems with getting medications. States he is taking medications as directed by doctor. Currently taking aspirin 325 mg daily.   Plan: Will follow up with patient in one week and follow care plan that has been developed. Will continue health assessments. Patient agrees with telephonic appointment that has been set up for next week.  Patient was given contact phone numbers for [facility] care management including 24 hour Nurse Advice Line.   Colleen CanLinda Earline Stiner, RN BSN CCM Care Management Coordinator [facility] Care Management  [phone]

## 2014-11-24 ENCOUNTER — Encounter: Payer: Self-pay | Admitting: *Deleted

## 2014-11-30 ENCOUNTER — Other Ambulatory Visit: Payer: Self-pay | Admitting: *Deleted

## 2014-11-30 NOTE — Patient Outreach (Signed)
[facility]) Care Management  11/30/2014  Eugene Dudley [birth year] [id number]   Follow up call to patient- Emmi-Stroke transition. Patient has had no symptoms of stroke since last contact. No hospital admission or emergency department visit.   Has heard from primary care office at [facility] in [place] and earliest appointment for follow up is June 9th-2016. States relative will take him to appointment. States primary care doctor wanted him to continue to take his medications as ordered upon hospital discharge and no changes in aspirin dosage.   Patient has met several goals as noted in updated care plan. Explanations and reinforcement interventions given to patient as noted in care plan.  Plan: will follow up; patient agrees with appointment date.   Sherrin Daisy, RN BSN [place] Management Coordinator [facility] Care Management  [phone]

## 2014-12-04 ENCOUNTER — Other Ambulatory Visit: Payer: Self-pay | Admitting: *Deleted

## 2014-12-04 NOTE — Patient Outreach (Signed)
[facility]) Care Management  12/04/2014  Anise Salvolan R Mcquiston 07/24/1940 [id number]  Emmi-stroke referral-red zone noted for "went to follow up appointment".    Telephone call to patient who advises that he has follow up appointment with primary care [facility] [place], [place] scheduled for June 9th. States he asked for earliest appointment for follow up after hospital stay and that was the earliest that was available. States primary care provider spoke with him on the phone regarding his care.  States he has appointment with neurologist July 27 th. Advised patient to let neurology office know on appointment date with primary care office. States he would do advise them.   Patient states he is in the process of trying to get all of his test results faxed to primary care doctor from hospital medical records as requested.   Patient voices that he has not had any stroke symptoms since we spoke last and voices action plan of notifying emergency personnel if symptoms occur. Taking medications as prescribed.  Plan: Will continue to follow up and follow care plan as noted. Patient agrees to next telephonic appointment set up.  Colleen CanLinda Aneka Fagerstrom, RN BSN CCM Care Management Coordinator [facility] Care Management  [phone]  .

## 2014-12-10 ENCOUNTER — Encounter: Payer: Self-pay | Admitting: *Deleted

## 2014-12-10 ENCOUNTER — Other Ambulatory Visit: Payer: Self-pay | Admitting: *Deleted

## 2014-12-10 NOTE — Patient Outreach (Signed)
[facility]) Care Management  12/10/2014  Anise Salvolan R Swider 01/24/1941 [id number]  Follow up Emmi-Stroke call: Patient alert and responding appropriately. States he has not experienced any stroke symptoms since our last contact. Patient was able to identify symptoms of stroke and understood the importance of notifying emergency personnel immediately. States he has support from family member as needed.  Patient reports that he was able to get test results faxed to his primary doctor [facility] [place] as  requested. States he has transportation arranged for upcoming appointment with primary care 12/17/2014. Taking medications as prescribed and no problem getting medications.  Reinforcement of identification of stroke symptoms and action plan verbalized to patient.  Plan: will follow up and follow care plan as noted. Patient agrees with date set for next telephonic appointment .   Colleen CanLinda Maricsa Sammons, RN BSN CCM Care Management Coordinator [facility] Care Management  [phone]

## 2014-12-18 ENCOUNTER — Other Ambulatory Visit: Payer: Self-pay | Admitting: *Deleted

## 2014-12-18 NOTE — Patient Outreach (Signed)
[facility]) Care Management  12/18/2014  Aeon Caulley Homann [birth year] [id number]   Emmi-Stroke follow up telephone call to patient. Left message on voice mail requesting call back.  Plan: Will follow up with patient. Colleen Can, RN BSN CCM Care Management Coordinator [facility] Care Management  [phone]

## 2014-12-24 ENCOUNTER — Other Ambulatory Visit: Payer: Self-pay | Admitting: *Deleted

## 2014-12-24 NOTE — Patient Outreach (Signed)
[facility]) Care Management  12/24/2014  Eugene Dudley Saintjean Apr 01, 1941 [id number]  Incoming call from patient who is returning call. Patient voices that he had emergency dental appointment on day of call. States he has seen several dental specialists and has received root canal and is currently taking antibiotic and pain medication.   Patient also reports that he has seen primary care doctor at [facility] on June 8th-states there were no changes in his medications and he understands importance of medication adherence. States he has neurology appointment at [facility] hospital on 12/31/2014 as recommended by primary care.   Patient voices he has not had any stroke symptoms since our last contact. Understands importance of early recognition and early reporting of symptoms & calling 911. Patient voices no emergency room visits or hospital admissions since last hospital admission.   Patient advised of symptoms of infection and or reporting to MD or dentist as needed. Voices understanding of importance of  taking all of antibiotic as prescribed by his dentist. Patient continues to have family support for transportation to MD appointments and other things as needed.    PLAN: will follow up next week; patient agrees to appointment set up for follow up call. Colleen Can, RN BSN CCM Care Management Coordinator [facility] Care Management  [phone]

## 2015-01-01 ENCOUNTER — Other Ambulatory Visit: Payer: Self-pay | Admitting: *Deleted

## 2015-01-01 NOTE — Patient Outreach (Signed)
[facility]) Care Management  01/01/2015  Eugene Dudley 05-15-1941 [id number]  Follow up call to patient. Patient reports that he had follow up visit with neurologist -Dr Luana Shu at  [facility] in [place], [place] 12/31/2014.  States neurologist adjusted ASA dosage and wrote prescription for Plavix. States he is to continue with current dosage of ASA and change dosage as directed when he receives his Plavix prescription.   Patient reports that he has not had any stroke symptoms,  no emergency room visits or hospital admissions since our last contact. States he has completed his antibiotics and dental problem is resolved.  Patient reports seeing dentist every 6 months.  Patient states he continues to have family support as needed. Re-enforcement of importance of medication adherence, keeping doctor's appointments, and early recognition and reporting of problems to medical personnel.   Patient goals have been met. See care plan as noted.  Patient voices understanding of  goals met.   Plan: Will close EMMI-stroke case.  Sherrin Daisy, RN BSN [place] Management Coordinator [facility] Care Management  [phone]

## 2015-01-05 ENCOUNTER — Encounter: Payer: Self-pay | Admitting: *Deleted

## 2015-02-03 ENCOUNTER — Encounter: Payer: Self-pay | Admitting: Neurology

## 2015-02-03 ENCOUNTER — Ambulatory Visit (INDEPENDENT_AMBULATORY_CARE_PROVIDER_SITE_OTHER): Payer: Medicare Other | Admitting: Neurology

## 2015-02-03 VITALS — BP 101/68 | HR 77 | Ht 71.0 in | Wt 244.8 lb

## 2015-02-03 DIAGNOSIS — I639 Cerebral infarction, unspecified: Secondary | ICD-10-CM | POA: Insufficient documentation

## 2015-02-03 DIAGNOSIS — I82509 Chronic embolism and thrombosis of unspecified deep veins of unspecified lower extremity: Secondary | ICD-10-CM | POA: Insufficient documentation

## 2015-02-03 DIAGNOSIS — E1159 Type 2 diabetes mellitus with other circulatory complications: Secondary | ICD-10-CM

## 2015-02-03 DIAGNOSIS — E785 Hyperlipidemia, unspecified: Secondary | ICD-10-CM | POA: Insufficient documentation

## 2015-02-03 DIAGNOSIS — I10 Essential (primary) hypertension: Secondary | ICD-10-CM | POA: Diagnosis not present

## 2015-02-03 DIAGNOSIS — I82503 Chronic embolism and thrombosis of unspecified deep veins of lower extremity, bilateral: Secondary | ICD-10-CM | POA: Diagnosis not present

## 2015-02-03 DIAGNOSIS — G43909 Migraine, unspecified, not intractable, without status migrainosus: Secondary | ICD-10-CM

## 2015-02-03 DIAGNOSIS — G43109 Migraine with aura, not intractable, without status migrainosus: Secondary | ICD-10-CM | POA: Insufficient documentation

## 2015-02-03 MED ORDER — PAROXETINE HCL 40 MG PO TABS: 40.0000 mg | ORAL_TABLET | Freq: Every day | ORAL | Status: AC

## 2015-02-03 MED ORDER — SIMVASTATIN 10 MG PO TABS
10.0000 mg | ORAL_TABLET | Freq: Every day | ORAL | Status: AC
Start: 2015-02-03 — End: ?

## 2015-02-03 MED ORDER — CLOPIDOGREL BISULFATE 75 MG PO TABS: 75.0000 mg | ORAL_TABLET | Freq: Every day | ORAL | Status: DC

## 2015-02-03 NOTE — Progress Notes (Signed)
STROKE NEUROLOGY FOLLOW UP NOTE  NAME: Eugene Dudley DOB: 11-10-1940  REASON FOR VISIT: stroke follow up HISTORY FROM: pt and chart  Today we had the pleasure of seeing Eugene Dudley in follow-up at our Neurology Clinic. Pt was accompanied by wife's sister.   History Summary Eugene Dudley is a 74 y.o. 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 He has hx of migraine aura with "blind spots" and "wavy lines" lasting 7-8 min, and sometimes with facial numbness and not able to talk for 5 min, sometimes with headache but most of time no headache. It happens once a month. Therefore, his presenting symptoms most likely complicated migraine. He also stated that ate too much salt the day before that HA episode in May. His headache resolved quickly in hospital.   However, his MRI did show a punctate infarct at left MCA/PCA, could be small vessel disease, as he has hx of HTN and DM and HLD as well as migraine with aura and age. However, we still recommend 30 day monitoring as outpt to rule out afib. But he follows up in [place] and only got 48 hours of Holter monitoring. Result not available. He was put on ASA 325mg  on discharge. However, this was changed to ASA 81mg  with plavix 75mg  [facility]. His LDL 25 on admission, so his zocor down from 40mg  to 10mg  now.   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 Due to chromic appearance, no specific treatment needed. However, he followed up with [facility] neurology and plan to do bubble study in 03/2015.   Interval history  During the interval time, he stated that his BP and glucose controlled well at home and his metformin was decreased to 500mg  daily. His BP today in clinic 101/68. He stated that 2 days ago he had episode of blind spots and wavy line together for 7-8 min and then resolved when mild HA started.  After one hour, the blind spot and wavy line came back again and lasted and resolved. 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   REVIEW OF SYSTEMS: Full 14 system review of systems performed and notable only for those listed below and in HPI above, all others are negative:  Constitutional:   Cardiovascular: Swelling in legs Ear/Nose/Throat:  Trouble swallowing Skin: Rash, moles Eyes:  Blind spots Respiratory:  SOB, snoring Gastroitestinal:   Genitourinary: Urination problems Hematology/Lymphatic:  Easy bruising Endocrine:  Musculoskeletal:  Joint pain, aching muscles Allergy/Immunology:  Runny nose Neurological:  Headache, numbness, difficulty swallowing Psychiatric: Depression, anxiety, decreased energy, disinterest in activities Sleep: Snoring  The following represents the patient's updated allergies and side effects list: No Known Allergies  The neurologically relevant items on the patient's problem list were reviewed on today's visit.  Neurologic Examination  A problem focused neurological exam (12 or more points of the single system neurologic examination, vital signs counts as 1 point, cranial nerves count for 8 points) was performed.  Blood pressure 101/68, pulse 77, height 5\' 11"  (1.803 m), weight 244 lb 12.8 oz (111.041 kg).  General - Well nourished, well developed, in no apparent distress.  Ophthalmologic - Sharp disc margins OU.   Cardiovascular - Regular rate and rhythm with no murmur.  Mental Status -  Level of arousal and orientation to time, place, and person were  intact. Language including expression, naming, repetition, comprehension was assessed and found intact. Fund of Knowledge was assessed and was intact.  Cranial Nerves II - XII - II - Visual field intact OU. III, IV, VI - Extraocular movements intact. V - Facial sensation intact bilaterally. VII - Facial movement intact bilaterally. VIII - Hearing & vestibular intact  bilaterally. X - Palate elevates symmetrically. XI - Chin turning & shoulder shrug intact bilaterally. XII - Tongue protrusion intact.  Motor Strength - The patient's strength was normal in all extremities and pronator drift was absent.  Bulk was normal and fasciculations were absent.   Motor Tone - Muscle tone was assessed at the neck and appendages and was normal.  Reflexes - The patient's reflexes were 1+ in all extremities and he had no pathological reflexes.  Sensory - Light touch, temperature/pinprick, vibration and proprioception, and Romberg testing were assessed and were normal.    Coordination - The patient had normal movements in the hands and feet with no ataxia or dysmetria.  Tremor was absent.  Gait and Station - The patient's transfers, posture, gait, station, and turns were observed as normal.  Data reviewed: I personally reviewed the images and agree with the radiology interpretations.  Ct Head Wo Contrast  11/16/2014 IMPRESSION: 1. No acute intracranial abnormalities. 2. Atrophy and chronic microvascular disease noted.   [name] (neuro Protocol)  11/16/2014 IMPRESSION: Subcentimeter area of acute infarction affects the LEFT lateral and posterior temporal occipital region. No associated hemorrhage. Moderately Advanced atrophy and small vessel disease.   Mr Maxine Glenn Head/brain Wo Cm  11/17/2014 IMPRESSION: 1. Moderate distal small vessel disease bilaterally, slightly more prominent in the right MCA distribution. 2. No significant proximal stenosis, aneurysm, or branch vessel occlusion.   2D echo  - Left ventricle: The cavity size was normal. There was mild focalbasal hypertrophy of the septum. Systolic function was normal.The estimated ejection fraction was in the range of 60% to 65%.Wall motion was normal; there were no regional wall motionabnormalities. Doppler parameters are consistent with abnormalleft ventricular relaxation (grade 1  diastolic dysfunction). TheE/e&' ratio is between 8-15, suggesting indetermiante LV fillingpressure. - Aortic valve: Trileaflet. Sclerosis without stenosis. There wasno regurgitation. - Mitral valve: Calcified annulus. There was trivial regurgitation. - Left atrium: The atrium was at the upper limits of normal insize. - Right ventricle: The cavity size was normal. Wall thickness wasnormal. Systolic function was normal. - Right atrium: The atrium was normal in size. - Inferior vena cava: The vessel was normal in size. Therespirophasic diameter changes were in the normal range (>= 50%),consistent with normal central venous pressure. Impressions: LVEF 60-65%, mild focal basal septal hypertrophy, normal LVfunction, grade 1 diastolic dysfunction, elevated LV fillingpressure.  LE venous doppler Positive for a mild amount of chronic appearing thrombus in the right popliteal vein and the left femoral vein.  Carotid Doppler No evidence of a significant stenosis in the right and left ICAs.    Holter monitor in [place] for 48 hours - result not available  Component     Latest Ref Rng 11/17/2014  Cholesterol     0 - 200 mg/dL 92  Triglycerides     <161 mg/dL 096 (H)  HDL Cholesterol     >40 mg/dL 35 (L)  Total CHOL/HDL Ratio      2.6  VLDL     0 - 40 mg/dL 32  LDL (calc)     0 - 99 mg/dL 25  Hemoglobin E4V     4.8 -  5.6 % 6.3 (H)  Mean Plasma Glucose      134    Assessment: As you may recall, he is a 74 y.o. 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 His episode most likely complicated migraine, however, MRI showed left MCA/PCA punctate infarct, incidental finding, stroke work up negative including CUS, TTE, MRA, however, LE venous doppler showed b/l chronic DVT. No acute treatment needed. He was discharged on ASA and recommend 30 day cardiac monitoring.  However, [facility] [place] and had 48 hour Holter monitoring and ASA  changed to dural antiplatelet and plan to do bubble study. As DVT is chronic, we will not perform bubble study here as it will not change management.  Plan:  - continue ASA  and plavix for total 3 months and then monotherapy for stroke prevention - continue zocor for stroke prevention - complicated migraine is not frequent, no need preventive medication. - bubble study not necessary as it will not change treatment plan. - check BP at home - Follow up with your primary care physician for stroke risk factor modification. Recommend maintain blood pressure goal <130/80, diabetes with hemoglobin A1c goal below 6.5% and lipids with LDL cholesterol goal below 70 mg/dL.  - DVT is chronic and no need acute treatment. - [facility]. - RTC as needed.  No orders of the defined types were placed in this encounter.    Meds ordered this encounter  Medications  . aspirin 81 MG tablet    Sig: Take 81 mg by mouth daily.  . Carboxymethylcell-Hypromellose (GENTEAL OP)    Sig: Apply 1 drop to eye as needed.  . clopidogrel (PLAVIX) 75 MG tablet    Sig: Take 1 tablet (75 mg total) by mouth daily.    Dispense:  30 tablet    Refill:  11  . simvastatin (ZOCOR) 10 MG tablet    Sig: Take 1 tablet (10 mg total) by mouth at bedtime.    Dispense:  30 tablet    Refill:  11  . PARoxetine (PAXIL) 40 MG tablet    Sig: Take 1 tablet (40 mg total) by mouth at bedtime.    Dispense:  30 tablet    Refill:  11    Patient Instructions  - continue ASA  and plavix for total 3 months and then one of them alone after for stroke prevention - continue zocor for stroke prevention - just want you to be aware that you have complicated migraine with or without headache which is an independent risk factor for stroke. As it is not frequent, you do not need preventive medication. - check BP at  home - Follow up with your primary care physician for stroke risk factor modification. Recommend maintain blood pressure goal <130/80, diabetes with hemoglobin A1c goal below 6.5% and lipids with LDL cholesterol goal below 70 mg/dL.  - your lower extremity thrombosis is chronic and no need acute treatment. - [facility] as [facility].    Marvel Plan, MD PhD [facility] [address], [address] [place], [place] [address] [phone]

## 2015-02-03 NOTE — Patient Instructions (Addendum)
-   continue ASA  and plavix for total 3 months and then one of them alone after for stroke prevention - continue zocor for stroke prevention - just want you to be aware that you have complicated migraine with or without headache which is an independent risk factor for stroke. As it is not frequent, you do not need preventive medication. - check BP at home - Follow up with your primary care physician for stroke risk factor modification. Recommend maintain blood pressure goal <130/80, diabetes with hemoglobin A1c goal below 6.5% and lipids with LDL cholesterol goal below 70 mg/dL.  - your lower extremity thrombosis is chronic and no need acute treatment. - [facility] as [facility].

## 2017-04-24 ENCOUNTER — Emergency Department (HOSPITAL_COMMUNITY): Payer: Medicare Other

## 2017-04-24 ENCOUNTER — Inpatient Hospital Stay (HOSPITAL_COMMUNITY)
Admission: EM | Admit: 2017-04-24 | Discharge: 2017-04-26 | DRG: 040 | Disposition: A | Discharge status: Home / Self Care | Payer: Medicare Other | Attending: Neurology | Admitting: Neurology

## 2017-04-24 ENCOUNTER — Encounter (HOSPITAL_COMMUNITY): Payer: Self-pay | Admitting: *Deleted

## 2017-04-24 ENCOUNTER — Inpatient Hospital Stay (HOSPITAL_COMMUNITY): Payer: Medicare Other

## 2017-04-24 DIAGNOSIS — I6300 Cerebral infarction due to thrombosis of unspecified precerebral artery: Secondary | ICD-10-CM | POA: Diagnosis not present

## 2017-04-24 DIAGNOSIS — Z7982 Long term (current) use of aspirin: Secondary | ICD-10-CM | POA: Diagnosis not present

## 2017-04-24 DIAGNOSIS — I10 Essential (primary) hypertension: Secondary | ICD-10-CM | POA: Diagnosis present

## 2017-04-24 DIAGNOSIS — Z888 Allergy status to other drugs, medicaments and biological substances status: Secondary | ICD-10-CM | POA: Diagnosis not present

## 2017-04-24 DIAGNOSIS — J9600 Acute respiratory failure, unspecified whether with hypoxia or hypercapnia: Secondary | ICD-10-CM | POA: Diagnosis present

## 2017-04-24 DIAGNOSIS — Z7984 Long term (current) use of oral hypoglycemic drugs: Secondary | ICD-10-CM

## 2017-04-24 DIAGNOSIS — I639 Cerebral infarction, unspecified: Secondary | ICD-10-CM | POA: Diagnosis present

## 2017-04-24 DIAGNOSIS — R1310 Dysphagia, unspecified: Secondary | ICD-10-CM | POA: Diagnosis present

## 2017-04-24 DIAGNOSIS — R4701 Aphasia: Secondary | ICD-10-CM | POA: Diagnosis present

## 2017-04-24 DIAGNOSIS — E119 Type 2 diabetes mellitus without complications: Secondary | ICD-10-CM | POA: Diagnosis present

## 2017-04-24 DIAGNOSIS — I63412 Cerebral infarction due to embolism of left middle cerebral artery: Secondary | ICD-10-CM | POA: Diagnosis present

## 2017-04-24 DIAGNOSIS — Z7902 Long term (current) use of antithrombotics/antiplatelets: Secondary | ICD-10-CM

## 2017-04-24 DIAGNOSIS — Z79899 Other long term (current) drug therapy: Secondary | ICD-10-CM

## 2017-04-24 DIAGNOSIS — E785 Hyperlipidemia, unspecified: Secondary | ICD-10-CM | POA: Diagnosis present

## 2017-04-24 DIAGNOSIS — R471 Dysarthria and anarthria: Secondary | ICD-10-CM | POA: Diagnosis present

## 2017-04-24 DIAGNOSIS — I674 Hypertensive encephalopathy: Secondary | ICD-10-CM | POA: Diagnosis present

## 2017-04-24 DIAGNOSIS — Z87891 Personal history of nicotine dependence: Secondary | ICD-10-CM | POA: Diagnosis not present

## 2017-04-24 DIAGNOSIS — Z9849 Cataract extraction status, unspecified eye: Secondary | ICD-10-CM

## 2017-04-24 DIAGNOSIS — Z9842 Cataract extraction status, left eye: Secondary | ICD-10-CM | POA: Diagnosis not present

## 2017-04-24 LAB — URINALYSIS, ROUTINE W REFLEX MICROSCOPIC
BACTERIA UA: NONE SEEN
Bilirubin Urine: NEGATIVE
Glucose, UA: NEGATIVE mg/dL
KETONES UR: NEGATIVE mg/dL
LEUKOCYTES UA: NEGATIVE
Nitrite: NEGATIVE
PROTEIN: 30 mg/dL — AB
SQUAMOUS EPITHELIAL / LPF: NONE SEEN
Specific Gravity, Urine: >1.046 — ABNORMAL HIGH (ref 1.005–1.030)
pH: 7 (ref 5.0–8.0)

## 2017-04-24 LAB — COMPREHENSIVE METABOLIC PANEL
ALK PHOS: 72 U/L (ref 38–126)
ALT: 20 U/L (ref 17–63)
ANION GAP: 9 (ref 5–15)
AST: 23 U/L (ref 15–41)
Albumin: 3.9 g/dL (ref 3.5–5.0)
BUN: 18 mg/dL (ref 6–20)
CALCIUM: 9.1 mg/dL (ref 8.9–10.3)
CHLORIDE: 103 mmol/L (ref 101–111)
CO2: 27 mmol/L (ref 22–32)
CREATININE: 1.26 mg/dL — AB (ref 0.61–1.24)
GFR, EST NON AFRICAN AMERICAN: 53 mL/min — AB (ref >60)
Glucose, Bld: 175 mg/dL — ABNORMAL HIGH (ref 65–99)
Potassium: 4 mmol/L (ref 3.5–5.1)
SODIUM: 139 mmol/L (ref 135–145)
Total Bilirubin: 0.7 mg/dL (ref 0.3–1.2)
Total Protein: 7.3 g/dL (ref 6.5–8.1)

## 2017-04-24 LAB — MRSA PCR SCREENING: MRSA BY PCR: NEGATIVE

## 2017-04-24 LAB — GLUCOSE, CAPILLARY
GLUCOSE-CAPILLARY: 146 mg/dL — AB (ref 65–99)
Glucose-Capillary: 130 mg/dL — ABNORMAL HIGH (ref 65–99)
Glucose-Capillary: 108 mg/dL — ABNORMAL HIGH (ref 65–99)

## 2017-04-24 LAB — I-STAT CHEM 8, ED
BUN: 21 mg/dL — ABNORMAL HIGH (ref 6–20)
CHLORIDE: 103 mmol/L (ref 101–111)
CREATININE: 1.1 mg/dL (ref 0.61–1.24)
Calcium, Ion: 1.07 mmol/L — ABNORMAL LOW (ref 1.15–1.40)
Glucose, Bld: 176 mg/dL — ABNORMAL HIGH (ref 65–99)
HEMATOCRIT: 47 % (ref 39.0–52.0)
Hemoglobin: 16 g/dL (ref 13.0–17.0)
Potassium: 4 mmol/L (ref 3.5–5.1)
Sodium: 140 mmol/L (ref 135–145)
TCO2: 26 mmol/L (ref 22–32)

## 2017-04-24 LAB — I-STAT TROPONIN, ED: Troponin i, poc: 0.01 ng/mL (ref 0.00–0.08)

## 2017-04-24 LAB — RAPID URINE DRUG SCREEN, HOSP PERFORMED
Amphetamines: NOT DETECTED
BARBITURATES: NOT DETECTED
BENZODIAZEPINES: NOT DETECTED
COCAINE: NOT DETECTED
OPIATES: NOT DETECTED
Tetrahydrocannabinol: NOT DETECTED

## 2017-04-24 LAB — CBC
HCT: 45 % (ref 39.0–52.0)
HEMOGLOBIN: 14.9 g/dL (ref 13.0–17.0)
MCH: 31.8 pg (ref 26.0–34.0)
MCHC: 33.1 g/dL (ref 30.0–36.0)
MCV: 95.9 fL (ref 78.0–100.0)
Platelets: 159 10*3/uL (ref 150–400)
RBC: 4.69 MIL/uL (ref 4.22–5.81)
RDW: 14.7 % (ref 11.5–15.5)
WBC: 11.2 10*3/uL — AB (ref 4.0–10.5)

## 2017-04-24 LAB — DIFFERENTIAL
BASOS ABS: 0 10*3/uL (ref 0.0–0.1)
BASOS PCT: 0 %
EOS PCT: 1 %
Eosinophils Absolute: 0.2 10*3/uL (ref 0.0–0.7)
LYMPHS ABS: 2 10*3/uL (ref 0.7–4.0)
LYMPHS PCT: 18 %
MONO ABS: 0.5 10*3/uL (ref 0.1–1.0)
MONOS PCT: 4 %
NEUTROS PCT: 77 %
Neutro Abs: 8.5 10*3/uL — ABNORMAL HIGH (ref 1.7–7.7)

## 2017-04-24 LAB — PROTIME-INR
INR: 0.95
Prothrombin Time: 12.6 seconds (ref 11.4–15.2)

## 2017-04-24 LAB — ETHANOL: Alcohol, Ethyl (B): <10 mg/dL (ref <10)

## 2017-04-24 LAB — APTT: aPTT: 26 seconds (ref 24–36)

## 2017-04-24 LAB — CBG MONITORING, ED: GLUCOSE-CAPILLARY: 161 mg/dL — AB (ref 65–99)

## 2017-04-24 MED ORDER — CLONAZEPAM 0.5 MG PO TABS
0.5000 mg | ORAL_TABLET | Freq: Four times a day (QID) | ORAL | Status: DC
  Administered 2017-04-25 – 2017-04-26 (×3): 0.5 mg via ORAL
  Filled 2017-04-24 (×3): qty 1

## 2017-04-24 MED ORDER — IOPAMIDOL (ISOVUE-370) INJECTION 76%
INTRAVENOUS | Status: AC
  Administered 2017-04-24: 50 mL via INTRAVENOUS
  Filled 2017-04-24: qty 50

## 2017-04-24 MED ORDER — LABETALOL HCL 5 MG/ML IV SOLN: 20.0000 mg | Freq: Once | INTRAVENOUS | Status: DC

## 2017-04-24 MED ORDER — SODIUM CHLORIDE 0.9 % IV SOLN
INTRAVENOUS | Status: DC
  Administered 2017-04-24 (×2): via INTRAVENOUS

## 2017-04-24 MED ORDER — SENNOSIDES-DOCUSATE SODIUM 8.6-50 MG PO TABS: 1.0000 | ORAL_TABLET | Freq: Every evening | ORAL | Status: DC | PRN

## 2017-04-24 MED ORDER — INSULIN ASPART 100 UNIT/ML ~~LOC~~ SOLN
0.0000 [IU] | Freq: Three times a day (TID) | SUBCUTANEOUS | Status: DC
  Administered 2017-04-24 – 2017-04-26 (×3): 2 [IU] via SUBCUTANEOUS

## 2017-04-24 MED ORDER — NICARDIPINE HCL IN NACL 20-0.86 MG/200ML-% IV SOLN: 0.0000 mg/h | INTRAVENOUS | Status: DC

## 2017-04-24 MED ORDER — ACETAMINOPHEN 325 MG PO TABS: 650.0000 mg | ORAL_TABLET | ORAL | Status: DC | PRN

## 2017-04-24 MED ORDER — PANTOPRAZOLE SODIUM 40 MG IV SOLR
40.0000 mg | Freq: Every day | INTRAVENOUS | Status: DC
  Administered 2017-04-24 – 2017-04-25 (×2): 40 mg via INTRAVENOUS
  Filled 2017-04-24 (×2): qty 40

## 2017-04-24 MED ORDER — ORAL CARE MOUTH RINSE
15.0000 mL | Freq: Two times a day (BID) | OROMUCOSAL | Status: DC
  Administered 2017-04-24 – 2017-04-26 (×3): 15 mL via OROMUCOSAL

## 2017-04-24 MED ORDER — ACETAMINOPHEN 650 MG RE SUPP: 650.0000 mg | RECTAL | Status: DC | PRN

## 2017-04-24 MED ORDER — STROKE: EARLY STAGES OF RECOVERY BOOK
Freq: Once | Status: DC
  Filled 2017-04-24: qty 1

## 2017-04-24 MED ORDER — ONDANSETRON HCL 4 MG/2ML IJ SOLN
4.0000 mg | Freq: Once | INTRAMUSCULAR | Status: AC
  Administered 2017-04-24: 4 mg via INTRAVENOUS
  Filled 2017-04-24: qty 2

## 2017-04-24 MED ORDER — ALTEPLASE (STROKE) FULL DOSE INFUSION
90.0000 mg | Freq: Once | INTRAVENOUS | Status: AC
  Administered 2017-04-24: 90 mg via INTRAVENOUS
  Filled 2017-04-24: qty 100

## 2017-04-24 MED ORDER — ACETAMINOPHEN 160 MG/5ML PO SOLN: 650.0000 mg | ORAL | Status: DC | PRN

## 2017-04-24 MED ORDER — IOPAMIDOL (ISOVUE-370) INJECTION 76%
INTRAVENOUS | Status: AC
Start: 2017-04-24 — End: 2017-04-24
  Administered 2017-04-24: 40 mL via INTRAVENOUS
  Filled 2017-04-24: qty 50

## 2017-04-24 NOTE — Progress Notes (Addendum)
Notified to mix tPA by Dr. Wilford Corner at [phone]. Given to Tami Ribas, RN at [phone]. Dose hung at 1201.  Rolin Schult D. Lenola Lockner, PharmD, BCPS Clinical Pharmacist Pager: [phone] Clinical Phone for 04/24/2017 until 3:30pm: [phone] If after 3:30pm, please call main pharmacy at [phone] 04/24/2017 12:00 PM

## 2017-04-24 NOTE — ED Provider Notes (Signed)
Emergency Department Provider Note   I have reviewed the triage vital signs and the nursing notes.   HISTORY  Chief Complaint No chief complaint on file.   HPI Eugene Dudley is a 76 y.o. male with PMH of DM, HLD, HTN, and prior TIA presents to the emergency department for evaluation as a CODE STROKE. According to EMS the patient's family member spoke with him at 10:30 AM and he was normal. The patient suddenly on up stating he did not feel well. Family called back at 10:45 AM and reported that his speech seemed slurred and confused. Patient taken to CT immediately by Neurology.   Level 5 caveat: Expressive aphasia  Past Medical History:  Diagnosis Date  . Diabetes mellitus without complication   . Hypercholesterolemia   . Hypertension   . Swollen ankles   . TIA (transient ischemic attack) 2011    Patient Active Problem List   Diagnosis Date Noted  . CVA (cerebral vascular accident) (HCC) 04/24/2017  . 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 Stroke (HCC) 02/03/2015  . 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 Essential hypertension 02/03/2015  . 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 HLD (hyperlipidemia) 02/03/2015  . Acute CVA (cerebrovascular accident) (HCC) 11/16/2014  . HTN (hypertension), benign 11/16/2014  . Hyperlipidemia 11/16/2014  . DM (diabetes mellitus) (HCC) 11/16/2014    Past Surgical History:  Procedure Laterality Date  . CATARACT EXTRACTION Left 2012  . CYSTOSCOPY  2015   Kidney  . HERNIA REPAIR    . TONSILLECTOMY      Current Outpatient Rx  . Order #: [id number] Class: Historical Med  . Order #: [id number] Class: Historical Med  . Order #: [id number] Class: Historical Med  . Order #: [id number] Class: Historical Med  . Order #: [id number] Class: Historical Med  . Order #: [id number] Class: No Print  . Order #: [id number] Class: Historical Med  . Order #: [id number] Class: Historical Med  .  Order #: [id number] Class: Historical Med  . Order #: [id number] Class: Historical Med  . Order #: [id number] Class: Historical Med  . Order #: [id number] Class: No Print  . Order #: [id number] Class: Normal    Allergies Patient has no known allergies.  Family History  Problem Relation Age of Onset  . Transient ischemic attack Mother   . Blindness Mother   . Heart murmur Mother   . Aneurysm Father   . Glaucoma Sister   . Glaucoma Maternal Grandfather     Social History Social History  Substance Use Topics  . Smoking status: Former Smoker    Packs/day: 2.00    Years: 40.00    Types: Cigarettes    Quit date: 10/09/1999  . Smokeless tobacco: Former Neurosurgeon    Quit date: 10/09/1999  . Alcohol use No    Review of Systems  Level 5 caveat: expressive aphasia and concern for acute CVA.   ____________________________________________   PHYSICAL EXAM:  VITAL SIGNS: Vitals:   04/24/17 1400 04/24/17 1415  BP: (!) 146/97 (!) 149/95  Pulse: 89 89  Resp: 17 17  Temp:    SpO2: 97% 97%     Constitutional: Alert and oriented. Airway patient.  Eyes: Conjunctivae are normal.  Head: Atraumatic. Nose: No congestion/rhinnorhea. Mouth/Throat: Mucous membranes are moist. Vomitus in the beard but airway is clear. Managing secretions.  Neck: No stridor.   Cardiovascular: Good peripheral circulation.    Respiratory: Normal respiratory effort.   Gastrointestinal: No distention.  Musculoskeletal: No lower extremity tenderness nor edema. No  gross deformities of extremities. Neurologic: Notable expressive aphasia and dysarthria. Moving extremities equally.  Skin:  Skin is warm, dry and intact. No rash noted.  ____________________________________________   LABS (all labs ordered are listed, but only abnormal results are displayed)  Labs Reviewed  CBC - Abnormal; Notable for the following:       Result Value   WBC 11.2 (*)    All other components within normal limits  DIFFERENTIAL - Abnormal;  Notable for the following:    Neutro Abs 8.5 (*)    All other components within normal limits  COMPREHENSIVE METABOLIC PANEL - Abnormal; Notable for the following:    Glucose, Bld 175 (*)    Creatinine, Ser 1.26 (*)    GFR calc non Af Amer 53 (*)    All other components within normal limits  URINALYSIS, ROUTINE W REFLEX MICROSCOPIC - Abnormal; Notable for the following:    Specific Gravity, Urine >1.046 (*)    Hgb urine dipstick MODERATE (*)    Protein, ur 30 (*)    All other components within normal limits  I-STAT CHEM 8, ED - Abnormal; Notable for the following:    BUN 21 (*)    Glucose, Bld 176 (*)    Calcium, Ion 1.07 (*)    All other components within normal limits  CBG MONITORING, ED - Abnormal; Notable for the following:    Glucose-Capillary 161 (*)    All other components within normal limits  PROTIME-INR  APTT  RAPID URINE DRUG SCREEN, HOSP PERFORMED  ETHANOL  I-STAT TROPONIN, ED  I-STAT CHEM 8, ED  I-STAT TROPONIN, ED   ____________________________________________  EKG   EKG Interpretation  Date/Time:  Tuesday April 24 2017 12:25:59 EDT Ventricular Rate:  85 PR Interval:    QRS Duration: 126 QT Interval:  417 QTC Calculation: 496 R Axis:   62 Text Interpretation:  Sinus rhythm Short PR interval IVCD, consider atypical RBBB No STEMI.  Confirmed by Alona Bene [phone]) on 04/24/2017 12:29:07 PM       ____________________________________________  RADIOLOGY  Ct Angio Head W Or Wo Contrast  Result Date: 04/24/2017 CLINICAL DATA:  76 y/o male presenting with abnormal speech/aphasia but no associated weakness. Status post IV tPA. EXAM: CT ANGIOGRAPHY HEAD AND NECK CT PERFUSION BRAIN TECHNIQUE: Multidetector CT imaging of the head and neck was performed using the standard protocol during bolus administration of intravenous contrast. Multiplanar CT image reconstructions and MIPs were obtained to evaluate the vascular anatomy. Carotid stenosis measurements  (when applicable) are obtained utilizing NASCET criteria, using the distal internal carotid diameter as the denominator. Multiphase CT imaging of the brain was performed following IV bolus contrast injection. Subsequent parametric perfusion maps were calculated using RAPID software. CONTRAST:  90 mL Isovue 370 COMPARISON:  Noncontrast head CT 1151 hours today. FINDINGS: CT Brain Perfusion Findings: CBF (<30%) Volume: 0 mL. Other less stringent CBF measurements are also negative. Perfusion (Tmax>6.0s) volume: 21 mL, located in the left occipital lobe. Mismatch Volume: 21 mL Infarction Location:  Not applicable CTA NECK Skeleton: Degenerative changes in the cervical spine. Reversal of lordosis. Ankylosis of C2-C3 posterior elements. No acute osseous abnormality identified. Upper chest: Calcified coronary artery atherosclerosis. No superior mediastinal lymphadenopathy. Negative lung apices. Other neck: Partially retropharyngeal course of both carotid arteries. Otherwise negative. No lymphadenopathy. Aortic arch: 3 vessel arch configuration. Minimal calcified arch atherosclerosis, no proximal great vessel stenosis. Right carotid system: Negative right CCA aside from tortuosity. Widely patent retropharyngeal right carotid bifurcation. Tortuous right ICA  but no stenosis to the skullbase. Left carotid system: Tortuous left CCA. Mild mostly calcified plaque at the left carotid bifurcation with no stenosis. Tortuous but otherwise negative cervical left ICA. Vertebral arteries: Tortuous proximal right subclavian artery. Normal right vertebral artery origin. Tortuous but otherwise negative cervical right vertebral artery. No proximal left subclavian artery stenosis despite mild plaque. Normal left vertebral artery origin. Tortuous left V1 segment. The left vertebral artery is mildly dominant and tortuous but otherwise negative to the skullbase. CTA HEAD Posterior circulation: Dominant distal left vertebral artery. Both  vertebral arteries seem to taper distal to the PICA origins, but there is no atherosclerotic distal vertebral stenosis identified. Patent vertebrobasilar junction. No basilar stenosis. AICA, SCA and PCA origins are patent. Posterior communicating arteries are diminutive or absent. Bilateral PCA branches are within normal limits. Anterior circulation: Patent ICA siphons with calcified plaque but no significant siphon stenosis. Normal ophthalmic artery origins. Patent carotid termini. Normal MCA and ACA origins. Dominant left A1 segment. Anterior communicating artery and bilateral ACA branches are within normal limits. Median artery of the corpus callosum which is somewhat dominant. Left MCA M1 segment, left MCA trifurcation, and left MCA branches are within normal limits. Right MCA M1 segment, right MCA bifurcation, and right MCA branches are within normal limits. Venous sinuses: Not evaluated due to little venous contrast on these early arterial phase images. Anatomic variants: Dominant left vertebral artery. Dominant left ACA A1 segment. Median artery of the corpus callosum which is dominant. Review of the MIP images confirms the above findings IMPRESSION: 1. Negative for emergent large vessel occlusion. 2. No core infarct detected by CT perfusion. CTP suggests only a small volume of possible ischemia in the posterior left circulation, which seems not to correlate to the patient's presentation. 3. CTA is positive for intermittent vessel tortuosity, but negative for significant arterial stenosis in the head or neck. Very mild for age atherosclerosis in the neck. 4. This study was preliminarily reviewed in person with Dr. Milon Dikes on 04/24/2017 at 1222 hours. Electronically Signed   By: Odessa Fleming M.D.   On: 04/24/2017 13:04   Ct Angio Neck W Or Wo Contrast  Result Date: 04/24/2017 CLINICAL DATA:  76 y/o male presenting with abnormal speech/aphasia but no associated weakness. Status post IV tPA. EXAM: CT  ANGIOGRAPHY HEAD AND NECK CT PERFUSION BRAIN TECHNIQUE: Multidetector CT imaging of the head and neck was performed using the standard protocol during bolus administration of intravenous contrast. Multiplanar CT image reconstructions and MIPs were obtained to evaluate the vascular anatomy. Carotid stenosis measurements (when applicable) are obtained utilizing NASCET criteria, using the distal internal carotid diameter as the denominator. Multiphase CT imaging of the brain was performed following IV bolus contrast injection. Subsequent parametric perfusion maps were calculated using RAPID software. CONTRAST:  90 mL Isovue 370 COMPARISON:  Noncontrast head CT 1151 hours today. FINDINGS: CT Brain Perfusion Findings: CBF (<30%) Volume: 0 mL. Other less stringent CBF measurements are also negative. Perfusion (Tmax>6.0s) volume: 21 mL, located in the left occipital lobe. Mismatch Volume: 21 mL Infarction Location:  Not applicable CTA NECK Skeleton: Degenerative changes in the cervical spine. Reversal of lordosis. Ankylosis of C2-C3 posterior elements. No acute osseous abnormality identified. Upper chest: Calcified coronary artery atherosclerosis. No superior mediastinal lymphadenopathy. Negative lung apices. Other neck: Partially retropharyngeal course of both carotid arteries. Otherwise negative. No lymphadenopathy. Aortic arch: 3 vessel arch configuration. Minimal calcified arch atherosclerosis, no proximal great vessel stenosis. Right carotid system: Negative right CCA aside  from tortuosity. Widely patent retropharyngeal right carotid bifurcation. Tortuous right ICA but no stenosis to the skullbase. Left carotid system: Tortuous left CCA. Mild mostly calcified plaque at the left carotid bifurcation with no stenosis. Tortuous but otherwise negative cervical left ICA. Vertebral arteries: Tortuous proximal right subclavian artery. Normal right vertebral artery origin. Tortuous but otherwise negative cervical right  vertebral artery. No proximal left subclavian artery stenosis despite mild plaque. Normal left vertebral artery origin. Tortuous left V1 segment. The left vertebral artery is mildly dominant and tortuous but otherwise negative to the skullbase. CTA HEAD Posterior circulation: Dominant distal left vertebral artery. Both vertebral arteries seem to taper distal to the PICA origins, but there is no atherosclerotic distal vertebral stenosis identified. Patent vertebrobasilar junction. No basilar stenosis. AICA, SCA and PCA origins are patent. Posterior communicating arteries are diminutive or absent. Bilateral PCA branches are within normal limits. Anterior circulation: Patent ICA siphons with calcified plaque but no significant siphon stenosis. Normal ophthalmic artery origins. Patent carotid termini. Normal MCA and ACA origins. Dominant left A1 segment. Anterior communicating artery and bilateral ACA branches are within normal limits. Median artery of the corpus callosum which is somewhat dominant. Left MCA M1 segment, left MCA trifurcation, and left MCA branches are within normal limits. Right MCA M1 segment, right MCA bifurcation, and right MCA branches are within normal limits. Venous sinuses: Not evaluated due to little venous contrast on these early arterial phase images. Anatomic variants: Dominant left vertebral artery. Dominant left ACA A1 segment. Median artery of the corpus callosum which is dominant. Review of the MIP images confirms the above findings IMPRESSION: 1. Negative for emergent large vessel occlusion. 2. No core infarct detected by CT perfusion. CTP suggests only a small volume of possible ischemia in the posterior left circulation, which seems not to correlate to the patient's presentation. 3. CTA is positive for intermittent vessel tortuosity, but negative for significant arterial stenosis in the head or neck. Very mild for age atherosclerosis in the neck. 4. This study was preliminarily  reviewed in person with Dr. Milon Dikes on 04/24/2017 at 1222 hours. Electronically Signed   By: Odessa Fleming M.D.   On: 04/24/2017 13:04   Ct Cerebral Perfusion W Contrast  Result Date: 04/24/2017 CLINICAL DATA:  76 y/o male presenting with abnormal speech/aphasia but no associated weakness. Status post IV tPA. EXAM: CT ANGIOGRAPHY HEAD AND NECK CT PERFUSION BRAIN TECHNIQUE: Multidetector CT imaging of the head and neck was performed using the standard protocol during bolus administration of intravenous contrast. Multiplanar CT image reconstructions and MIPs were obtained to evaluate the vascular anatomy. Carotid stenosis measurements (when applicable) are obtained utilizing NASCET criteria, using the distal internal carotid diameter as the denominator. Multiphase CT imaging of the brain was performed following IV bolus contrast injection. Subsequent parametric perfusion maps were calculated using RAPID software. CONTRAST:  90 mL Isovue 370 COMPARISON:  Noncontrast head CT 1151 hours today. FINDINGS: CT Brain Perfusion Findings: CBF (<30%) Volume: 0 mL. Other less stringent CBF measurements are also negative. Perfusion (Tmax>6.0s) volume: 21 mL, located in the left occipital lobe. Mismatch Volume: 21 mL Infarction Location:  Not applicable CTA NECK Skeleton: Degenerative changes in the cervical spine. Reversal of lordosis. Ankylosis of C2-C3 posterior elements. No acute osseous abnormality identified. Upper chest: Calcified coronary artery atherosclerosis. No superior mediastinal lymphadenopathy. Negative lung apices. Other neck: Partially retropharyngeal course of both carotid arteries. Otherwise negative. No lymphadenopathy. Aortic arch: 3 vessel arch configuration. Minimal calcified arch atherosclerosis, no proximal great  vessel stenosis. Right carotid system: Negative right CCA aside from tortuosity. Widely patent retropharyngeal right carotid bifurcation. Tortuous right ICA but no stenosis to the skullbase.  Left carotid system: Tortuous left CCA. Mild mostly calcified plaque at the left carotid bifurcation with no stenosis. Tortuous but otherwise negative cervical left ICA. Vertebral arteries: Tortuous proximal right subclavian artery. Normal right vertebral artery origin. Tortuous but otherwise negative cervical right vertebral artery. No proximal left subclavian artery stenosis despite mild plaque. Normal left vertebral artery origin. Tortuous left V1 segment. The left vertebral artery is mildly dominant and tortuous but otherwise negative to the skullbase. CTA HEAD Posterior circulation: Dominant distal left vertebral artery. Both vertebral arteries seem to taper distal to the PICA origins, but there is no atherosclerotic distal vertebral stenosis identified. Patent vertebrobasilar junction. No basilar stenosis. AICA, SCA and PCA origins are patent. Posterior communicating arteries are diminutive or absent. Bilateral PCA branches are within normal limits. Anterior circulation: Patent ICA siphons with calcified plaque but no significant siphon stenosis. Normal ophthalmic artery origins. Patent carotid termini. Normal MCA and ACA origins. Dominant left A1 segment. Anterior communicating artery and bilateral ACA branches are within normal limits. Median artery of the corpus callosum which is somewhat dominant. Left MCA M1 segment, left MCA trifurcation, and left MCA branches are within normal limits. Right MCA M1 segment, right MCA bifurcation, and right MCA branches are within normal limits. Venous sinuses: Not evaluated due to little venous contrast on these early arterial phase images. Anatomic variants: Dominant left vertebral artery. Dominant left ACA A1 segment. Median artery of the corpus callosum which is dominant. Review of the MIP images confirms the above findings IMPRESSION: 1. Negative for emergent large vessel occlusion. 2. No core infarct detected by CT perfusion. CTP suggests only a small volume of  possible ischemia in the posterior left circulation, which seems not to correlate to the patient's presentation. 3. CTA is positive for intermittent vessel tortuosity, but negative for significant arterial stenosis in the head or neck. Very mild for age atherosclerosis in the neck. 4. This study was preliminarily reviewed in person with Dr. Milon Dikes on 04/24/2017 at 1222 hours. Electronically Signed   By: Odessa Fleming M.D.   On: 04/24/2017 13:04   Dg Chest Portable 1 View  Result Date: 04/24/2017 CLINICAL DATA:  Stroke. EXAM: PORTABLE CHEST 1 VIEW COMPARISON:  None. FINDINGS: Mild cardiomegaly. Pulmonary vascular congestion. Increased interstitial markings. Low lung volumes with bronchovascular crowding and bibasilar opacities. No pleural effusion or pneumothorax. No acute osseous abnormality. IMPRESSION: 1. Cardiomegaly with pulmonary vascular congestion and interstitial edema. 2. Low lung volumes with bibasilar opacities, likely atelectasis. Electronically Signed   By: Obie Dredge M.D.   On: 04/24/2017 13:27   Ct Head Code Stroke Wo Contrast  Result Date: 04/24/2017 CLINICAL DATA:  Code stroke. 76 year old male with abnormal speech. Last seen well 1030 hours. EXAM: CT HEAD WITHOUT CONTRAST TECHNIQUE: Contiguous axial images were obtained from the base of the skull through the vertex without intravenous contrast. COMPARISON:  Brain MRI, MRA and noncontrast head CT 11/16/2014 and 11/17/2014. FINDINGS: Brain: Cerebral volume is not significantly changed. No midline shift, mass effect, or evidence of intracranial mass lesion. No acute intracranial hemorrhage identified. No ventriculomegaly. Patchy periventricular white matter hypodensity has progressed. No cortical encephalomalacia identified. No cortically based acute infarct identified. Vascular: Calcified atherosclerosis at the skull base. No suspicious intracranial vascular hyperdensity. Skull: Stable.  No acute osseous abnormality identified.  Sinuses/Orbits: Chronic frontal sinus mucosal thickening has not  significantly changed. Otherwise clear. Other: Stable orbit and scalp soft tissues. ASPECTS [facility] Stroke Program Early CT Score) - Ganglionic level infarction (caudate, lentiform nuclei, internal capsule, insula, M1-M3 cortex): 7 - Supraganglionic infarction (M4-M6 cortex): 3 Total score (0-10 with 10 being normal): 10 IMPRESSION: 1. Increased nonspecific cerebral white matter hypodensity since 2016, but otherwise stable with no acute hemorrhage or cortically based infarct identified. 2. ASPECTS is 10. 3. Study discussed by telephone with Dr. Milon Dikes on 04/24/2017 at 12:01 . Electronically Signed   By: Odessa Fleming M.D.   On: 04/24/2017 12:01    ____________________________________________   PROCEDURES  Procedure(s) performed:   Procedures  None ____________________________________________   INITIAL IMPRESSION / ASSESSMENT AND PLAN / ED COURSE  Pertinent labs & imaging results that were available during my care of the patient were reviewed by me and considered in my medical decision making (see chart for details).  Patient arrives to the emergency department as a code stroke. I performed a an initial evaluation on arrival. Patient airway is clear for CT. Brief neurological exam concerning for acute ischemia. Patient taken to CT by neurology and ultimately given tPA. Patient awaiting ICU bed.   01:15 PM Patient with some vomiting. Ordered Zofran. NIH only slightly improved after tPA (6 now down from 7). Continue to follow.  ____________________________________________  FINAL CLINICAL IMPRESSION(S) / ED DIAGNOSES  Final diagnoses:  Cerebrovascular accident (CVA), unspecified mechanism (HCC)     MEDICATIONS GIVEN DURING THIS VISIT:  Medications  0.9 %  sodium chloride infusion ( Intravenous New Bag/Given 04/24/17 1304)  clonazePAM (KLONOPIN) tablet 0.5 mg (not administered)  insulin aspart (novoLOG) injection 0-15  Units (not administered)  iopamidol (ISOVUE-370) 76 % injection (40 mLs Intravenous Contrast Given 04/24/17 1209)  alteplase (ACTIVASE) 1 mg/mL infusion 90 mg (0 mg Intravenous Stopped 04/24/17 1303)  iopamidol (ISOVUE-370) 76 % injection (50 mLs Intravenous Contrast Given 04/24/17 1210)  ondansetron (ZOFRAN) injection 4 mg (4 mg Intravenous Given 04/24/17 1334)     NEW OUTPATIENT MEDICATIONS STARTED DURING THIS VISIT:  None  Note:  This document was prepared using Dragon voice recognition software and may include unintentional dictation errors.  Alona Bene, MD Emergency Medicine    Jerid Catherman, Arlyss Repress, MD 04/24/17 [phone]

## 2017-04-24 NOTE — H&P (Signed)
Stroke Admission H&P  HPI:                                                                                                                                         Eugene Dudley is an 77 y.o. male presenting to the hospital via EMS for garbled speech. All history is obtained from EMS and the chart as patient is unable to provide any history. The patient called his sister on the phone at around 10:30 AM, he sounded normal according to report but he said that he wasn't feeling right. There is no details available of what wasn't right at that time. The sister called EMS. When EMS arrived, he was slurring his words, making no sense in terms of his speech. No other focal deficits were noted by EMS. He was brought in to the [facility] ER as an emergent code stroke for evaluation. The patient was unable to provide any history to [place] on the initial encounter. Chart review showed that he was admitted for headache with altered mental status, and an MRI that was done as a part of the evaluation showed a small cortical embolic looking infarcts in the left MCA PCA territory, which was thought to be an incidental finding at that time. According to chart, he is on aspirin and Plavix forstroke prevention and is not on any blood thinners. As far as history could be ascertained by EMS, no major surgeries done in the recent past.e aphasia. HE was brought to [facility] where he was unable to state name, year, showed expressive aphasia and dysarthria. CT head was normal and tPA was administered.  EMS also reported that the patient started vomiting en route.he also complained of headache, unsure how much of the history is unreliable  Date last known well: Date: 04/24/2017 Time last known well: Time: 10:30 tPA Given: Yes NIHSS--7 Modified Rankin: Rankin Score=0   Past Medical History:  Diagnosis Date  . Diabetes mellitus without complication   . Hypercholesterolemia   . Hypertension   . Swollen ankles   . TIA  (transient ischemic attack) 2011    Past Surgical History:  Procedure Laterality Date  . CATARACT EXTRACTION Left 2012  . CYSTOSCOPY  2015   Kidney  . HERNIA REPAIR    . TONSILLECTOMY      Family History  Problem Relation Age of Onset  . Transient ischemic attack Mother   . Blindness Mother   . Heart murmur Mother   . Aneurysm Father   . Glaucoma Sister   . Glaucoma Maternal Grandfather    Social History:  reports that he quit smoking about 17 years ago. His smoking use included Cigarettes. He has a 80.00 pack-year smoking history. He quit smokeless tobacco use about 17 years ago. He reports that he does not drink alcohol or use drugs.  Allergies: No Known Allergies  Medications:  Current Facility-Administered Medications  Medication Dose Route Frequency Provider Last Rate Last Dose  . alteplase (ACTIVASE) 1 mg/mL infusion 90 mg  90 mg Intravenous Once Milon Dikes, MD      . clonazePAM Scarlette Calico) tablet 0.5 mg  0.5 mg Oral QID Ulice Dash, PA-C      . iopamidol (ISOVUE-370) 76 % injection            Current Outpatient Prescriptions  Medication Sig Dispense Refill  . alfuzosin (UROXATRAL) 10 MG 24 hr tablet Take 10 mg by mouth at bedtime.     Marland Kitchen aspirin 81 MG tablet Take 81 mg by mouth daily.    . Carboxymethylcell-Hypromellose (GENTEAL OP) Apply 1 drop to eye as needed.    . Cholecalciferol 1000 UNITS capsule Take 3,000 Units by mouth at bedtime.     . clonazePAM (KLONOPIN) 0.5 MG tablet Take 0.5 mg by mouth 4 (four) times daily.    . clopidogrel (PLAVIX) 75 MG tablet Take 1 tablet (75 mg total) by mouth daily. 30 tablet 11  . HYDROcodone-acetaminophen (NORCO/VICODIN) 5-325 MG per tablet Take 1 tablet by mouth every 6 (six) hours as needed for moderate pain (Taking every 4-5hrs as needed for tooth pain).    Marland Kitchen lisinopril (PRINIVIL,ZESTRIL) 10 MG tablet  Take 5 mg by mouth at bedtime.    . metFORMIN (GLUCOPHAGE) 500 MG tablet Take 500 mg by mouth daily.     . Omega-3 Fatty Acids (FISH OIL) 1200 MG CAPS Take 3,600 mg by mouth at bedtime.    . pantoprazole (PROTONIX) 40 MG tablet Take 40 mg by mouth 2 (two) times daily.    Marland Kitchen PARoxetine (PAXIL) 40 MG tablet Take 1 tablet (40 mg total) by mouth at bedtime. 30 tablet 11  . simvastatin (ZOCOR) 10 MG tablet Take 1 tablet (10 mg total) by mouth at bedtime. 30 tablet 11    ROS:                                                                                                                                       History obtained from unobtainable from patient due to aphasia   Neurologic Examination:                                                                                                      Weight 112.7 kg (248 lb 7.3 oz).  HEENT-  Normocephalic, no lesions, without obvious abnormality.  Normal external eye and conjunctiva.  Normal TM's bilaterally.  Normal auditory canals and external ears. Normal external nose, mucus membranes and septum.  Normal pharynx.Fresh vomitus all over his beard Cardiovascular- S1, S2 normal, pulses palpable throughout   Lungs- chest clear, no wheezing, rales, normal symmetric air entry Abdomen- normal findings: bowel sounds normal Extremities- no skin discoloration Lymph-no adenopathy palpable Musculoskeletal-no joint tenderness, deformity or swelling Skin-warm and dry, no hyperpigmentation, vitiligo, or suspicious lesions  Neurological Examination Mental Status: Alert,  Oriented to self and name one word simple objects. Speech dysarthric with evidence of expressive aphasia (almost word salad) and unable to form complete sentences.  Unable to follow any commands.unable to repeat. Unable to read complete sentences. Cranial Nerves: II: ; Visual fields grossly normal,  III,IV, VI: ptosis not present, extra-ocular motions intact bilaterally, pupils equal, round,  reactive to light and accommodation V,VII: smile symmetric, facial light touch sensation normal bilaterally VIII: hearing normal bilaterally IX,X: uvula rises symmetrically XI: bilateral shoulder shrug XII: midline tongue extension Motor: Right : Upper extremity   5/5    Left:     Upper extremity   5/5  Lower extremity   5/5     Lower extremity   5/5 Tone and bulk:normal tone throughout; no atrophy noted Sensory: Pinprick and light touch intact throughout, bilaterally Deep Tendon Reflexes: 2+ and symmetric throughout Cerebellar: Unable to perform secondary to aphasia but no dysmetria noted with normal movements and reach for objects Gait: not tested   Lab Results: Basic Metabolic Panel:  Recent Labs Lab 04/24/17 1205  NA 140  K 4.0  CL 103  GLUCOSE 176*  BUN 21*  CREATININE 1.10   CBC:  Recent Labs Lab 04/24/17 1205  HGB 16.0  HCT 47.0    CBG:  Recent Labs Lab 04/24/17 1156  GLUCAP 161*    Assessment and plan discussed with with attending physician and they are in agreement.    Felicie Morn PA-C [facility] [phone]  04/24/2017, 12:02 PM   Attending addendum I have seen and examined the patient. He was brought in as a code stroke. I have made changes/edits to the note as needed. I agree with the history and physical documented above. My assessment and plan are below. I have reviewed the imaging studies. Noncontrast CT scan of the head does not show any acute changes. Aspects 10. CT angiogram head and neck and CT perfusion study does not show any large vessel occlusion. CT perfusion study does show some perfusion abnormality in the left MCA PCA territory with no core. But there was no large vessel occlusion and hence he was not a candidate for endovascular intervention.  Assessment: 76 y.o. male brought in after he developed garbled speech this morning, last known normal 10:30 AM. Brought into the ER at 11:40 AM. Noncontrast CT of the head was  reviewed by me, that did not show any acute bleed or earlychanges of ischemic stroke. His NIH stroke scale on presentation was a 7. Most of the points in his NIH stroke scale were related to his aphasia, which is a disabling symptom. He was within the 4.5 hour window with a last seen normal. IV TPA was initiated at 1201 hrs. Total dose 90 mg-9 milligram bolus, 81 mg infusion.  Impression Acute ischemic stroke-cardio embolic - e is status post IV TPA Evaluate for migraine-has a history of migraine with altered mental status Evaluate for toxic metabolic encephalopathy  Stroke Risk Factors - diabetes mellitus, hyperlipidemia and hypertension Plan:  Acute Ischemic Stroke Cerebral infarction due to embolism of  left middle cerebral artery  Acuity: Acute Current Suspected Etiology: Cardio embolic Continue Evaluation:  -Admit to:ICU (post tPA protocol) -Hold Aspirin until 24 hour post tPA neuroimaging is stable and without evidence of bleeding -Blood pressure control, goal of SYS <180 -MRI/ECHO/A1C/Lipid panel. -Hyperglycemia management per SSI to maintain glucose 140-180mg /dL. -PT/OT/ST therapies and recommendations when able  CNS -Close neuro monitoring  Dysarthria Dysphagia following cerebral infarction  -NPO until cleared by speech -ST -Advance diet as tolerated  Toxic encephalopathy -Correct metabolic causes -Monitor  RESP Acute Respiratory Failure  -vent management per ICU -wean when able  CV Essential (primary) hypertension -Aggressive BP control, goal SBP < 180 -TTE  Hyperlipidemia, unspecified  - Statin for goal LDL < 70  Evaluate for underlying arrhythmia-A. fib -Continue telemetry  HEME No acute issues Monitor clinically  Coagulopathy secondary to tPA given in ER -PT/INR in AM -post tPA care/vitals  ENDO -SSI -goal HgbA1c < 7  GI/GU Acute Kidney injury -Gentle hydration  Nausea and vomiting -Zofran when necessary -Aspiration  precautions  Fluid/Electrolyte Disorders -plan BMP, replete lites as necessary  ID Possible Aspiration PNA 2/2 vomiting -CXR -NPO -Monitor  Nutrition E66.9 Obesity  -diet consult  Prophylaxis DVT: scd   GI: NA Bowel: doc/senna   Diet: NPO until cleared by bedside eval or speech  Code Status: Full Code   THE FOLLOWING WERE PRESENT ON ADMISSION: CNS -  Acute Ischemic Stroke,Toxic Metabolic Hypertensive Encephalopathy, dysarthria, dysphagia Respiratory - Probable Aspiration Pneumonia Cardiovascular - essential HTN  CRITICAL CARE Performed by: Milon Dikes Total critical care time: 44 minutes Critical care time was exclusive of separately billable procedures and treating other patients. Critical care was necessary to treat or prevent imminent or life-threatening deterioration. Critical care was time spent personally by me on the following activities: development of treatment plan with patient and/or surrogate as well as nursing, discussions with consultants, evaluation of patient's response to treatment, examination of patient, obtaining history from patient or surrogate, ordering and performing treatments and interventions, ordering and review of laboratory studies, ordering and review of radiographic studies, pulse oximetry and re-evaluation of patient's condition.  Milon Dikes, MD [facility] [phone]  If 7pm to 7am, please call on call as listed on AMION.

## 2017-04-24 NOTE — Code Documentation (Signed)
76yo male arriving to [facility] via [facility] at 1140.  Patient from home where he called his sister-in-law at 60 and told his sister he was not feeling well.  EMS reports patient's speech was normal at that time.  EMS assessed patient to have garbled speech and activated a code stroke.  Stroke team to the bedside on patient arrival.  Patient cleared for CT by Dr. Jacqulyn Bath.  Patient to CT with team.  Patient actively vomiting in CT.  CT completed.  PIV x 2 started and labs drawn by ED RN.  Vital signs assessed and BP within tPA parameters.  Pharmacy at the bedside and notified to mix tPA.  NIHSS 7, see documentation for details and code stroke times.  Patient with global aphasia - not able to answer NIHSS questions or follow commands.  Patient with h/o stroke on ASA and Plavix, high cholesterol, hypertension, and diabetes per EMS report.   tPA bolus given at 1201 followed by /hr for a total of  per pharmacy dosing.  CTA and CTP completed.  Patient to B19 with team.  Patient monitored frequently per post-tPA protocol.  ICU charge RN notified of patient.  Patient's sister-in-law to the bedside and updated on plan of care. tPA infusion completed and NS flush hung.  Patient to be admitted to ICU when bed available.  Bedside handoff with ED RN [place].

## 2017-04-25 ENCOUNTER — Inpatient Hospital Stay (HOSPITAL_COMMUNITY): Payer: Medicare Other

## 2017-04-25 ENCOUNTER — Encounter (HOSPITAL_COMMUNITY): Payer: Self-pay | Admitting: *Deleted

## 2017-04-25 DIAGNOSIS — I63412 Cerebral infarction due to embolism of left middle cerebral artery: Secondary | ICD-10-CM

## 2017-04-25 DIAGNOSIS — I6300 Cerebral infarction due to thrombosis of unspecified precerebral artery: Secondary | ICD-10-CM

## 2017-04-25 LAB — HEMOGLOBIN A1C
Hgb A1c MFr Bld: 6.2 % — ABNORMAL HIGH (ref 4.8–5.6)
Mean Plasma Glucose: 131.24 mg/dL

## 2017-04-25 LAB — GLUCOSE, CAPILLARY
GLUCOSE-CAPILLARY: 104 mg/dL — AB (ref 65–99)
GLUCOSE-CAPILLARY: 126 mg/dL — AB (ref 65–99)
Glucose-Capillary: 96 mg/dL (ref 65–99)
Glucose-Capillary: 88 mg/dL (ref 65–99)
Glucose-Capillary: 139 mg/dL — ABNORMAL HIGH (ref 65–99)
Glucose-Capillary: 132 mg/dL — ABNORMAL HIGH (ref 65–99)

## 2017-04-25 LAB — LIPID PANEL
CHOL/HDL RATIO: 3.9 ratio
Cholesterol: 131 mg/dL (ref 0–200)
HDL: 34 mg/dL — AB (ref >40)
LDL Cholesterol: 68 mg/dL (ref 0–99)
TRIGLYCERIDES: 145 mg/dL (ref <150)
VLDL: 29 mg/dL (ref 0–40)

## 2017-04-25 LAB — ECHOCARDIOGRAM COMPLETE
Height: 72 in
WEIGHTICAEL: 3883.62 [oz_av]

## 2017-04-25 NOTE — Progress Notes (Signed)
STROKE TEAM PROGRESS NOTE   HISTORY OF PRESENT ILLNESS (per record) Eugene Dudley is an 76 y.o. male presenting to the hospital via EMS for garbled speech. All history is obtained from EMS and the chart as patient is unable to provide any history. The patient called his sister on the phone at around 10:30 AM, he sounded normal according to report but he said that he wasn't feeling right. There is no details available of what wasn't right at that time. The sister called EMS. When EMS arrived, he was slurring his words, making no sense in terms of his speech. No other focal deficits were noted by EMS. He was brought in to the [facility] ER as an emergent code stroke for evaluation. The patient was unable to provide any history to [place] on the initial encounter. Chart review showed that he was admitted for headache with altered mental status, and an MRI that was done as a part of the evaluation showed a small cortical embolic looking infarcts in the left MCA PCA territory, which was thought to be an incidental finding at that time. According to chart, he is on aspirin and Plavix forstroke prevention and is not on any blood thinners. As far as history could be ascertained by EMS, no major surgeries done in the recent past.e aphasia. HE was brought to [facility] where he was unable to state name, year, showed expressive aphasia and dysarthria. CT head was normal and tPA was administered.  EMS also reported that the patient started vomiting en route.he also complained of headache, unsure how much of the history is unreliable  Date last known well: Date: 04/24/2017 Time last known well: Time: 10:30 tPA Given: Yes NIHSS--7 Modified Rankin: Rankin Score=0  He was admitted to the neuro ICU for further evaluation and treatment.   SUBJECTIVE (INTERVAL HISTORY) No family at the bedside.  He is resting comfortably and appears to be much improved.blood pressure is well controlled.   He has no  complaints. OBJECTIVE   CBC:   Recent Labs Lab 04/24/17 1155 04/24/17 1205  WBC 11.2*  --   NEUTROABS 8.5*  --   HGB 14.9 16.0  HCT 45.0 47.0  MCV 95.9  --   PLT 159  --     Basic Metabolic Panel:   Recent Labs Lab 04/24/17 1155 04/24/17 1205  NA 139 140  K 4.0 4.0  CL 103 103  CO2 27  --   GLUCOSE 175* 176*  BUN 18 21*  CREATININE 1.26* 1.10  CALCIUM 9.1  --     Lipid Panel:     Component Value Date/Time   CHOL 131 04/25/2017 0303   TRIG 145 04/25/2017 0303   HDL 34 (L) 04/25/2017 0303   CHOLHDL 3.9 04/25/2017 0303   VLDL 29 04/25/2017 0303   LDLCALC 68 04/25/2017 0303   HgbA1c:  Lab Results  Component Value Date   HGBA1C 6.2 (H) 04/25/2017   Urine Drug Screen:     Component Value Date/Time   LABOPIA NONE DETECTED 04/24/2017 1326   COCAINSCRNUR NONE DETECTED 04/24/2017 1326   LABBENZ NONE DETECTED 04/24/2017 1326   AMPHETMU NONE DETECTED 04/24/2017 1326   THCU NONE DETECTED 04/24/2017 1326   LABBARB NONE DETECTED 04/24/2017 1326    Alcohol Level     Component Value Date/Time   ETH <10 04/24/2017 1833    IMAGING  Dg Chest Portable 1 View 04/24/2017 1. Cardiomegaly with pulmonary vascular congestion and interstitial edema. 2. Low lung volumes with  bibasilar opacities, likely atelectasis.   Ct Head Code Stroke Wo Contrast 04/24/2017 1. Increased nonspecific cerebral white matter hypodensity since 2016, but otherwise stable with no acute hemorrhage or cortically based infarct identified. 2. ASPECTS is 10.  Ct Angio Head W Or Wo Contrast Ct Angio Neck W Or Wo Contrast Ct Cerebral Perfusion W Contrast 04/24/2017 1. Negative for emergent large vessel occlusion. 2. No core infarct detected by CT perfusion. CTP suggests only a small volume of possible ischemia in the posterior left circulation, which seems not to correlate to the patient's presentation. 3. CTA is positive for intermittent vessel tortuosity, but negative for significant  arterial stenosis in the head or neck. Very mild for age atherosclerosis in the neck.  [name] 04/25/2017 No evidence for acute stroke.  Chronic changes as described. No apparent tPA complications.  2D Echocardiogram Pending  PHYSICAL EXAM  Temp:  [98.1 F (36.7 C)-98.7 F (37.1 C)] 98.1 F (36.7 C) (10/17 1200) Pulse Rate:  [65-93] 67 (10/17 1300) Resp:  [14-22] 14 (10/17 1300) BP: (126-157)/(60-107) 134/99 (10/17 1300) SpO2:  [90 %-100 %] 91 % (10/17 1300) Weight:  [242 lb 11.6 oz (110.1 kg)] 242 lb 11.6 oz (110.1 kg) (10/16 1745)  General - Well nourished, well developed, in no apparent distress.  Cardiovascular - Regular rate and rhythm.  Mental Status -  Level of arousal and orientation to time, place, and person were intact. Language demonstrated hesitancy and slowing with word finding, no dysarthria Attention span and concentration were normal. Remote memory was slow but intact  Cranial Nerves II - XII - II - Visual field intact OU. III, IV, VI - Extraocular movements intact. V - Facial sensation intact bilaterally. VII - Facial movement intact bilaterally. VIII - Hearing & vestibular intact bilaterally. X - Palate elevates symmetrically. XI - Chin turning & shoulder shrug intact bilaterally. XII - Tongue protrusion intact.  Motor Strength - The patient's strength was 5/5 in all extremities  Motor Tone - Muscle tone was assessed at the neck and appendages and was normal.  Reflexes - The patient's reflexes were 1+ in all extremities and he had no pathological reflexes.  Sensory - Light touch was assessed was symmetrical.    Coordination - The patient had normal movements in the hands and feet with no ataxia or dysmetria.  Gait and Station - Not assessed   ASSESSMENT/PLAN [name] is a 76 y.o. male with history of hypertension, hyperlipidemia, diabetes mellitus, TIA, migraines presenting with slurring speech. He received IV tPA 10/16  @1201 .   Stroke:  Posterior L MCA infarct embolic in pattern without demonstration on repeated imaging with MRI head today after tPA administration. Unclear if current very mild deficits are attributable.  Resultant  Mild expressive aphasia - hesitance and slowing with word finding  CT head Increased nonspecific cerebral white matter hypodensity without visible acute infarct  CTA Head/Neck No core infarct detected by CT perfusion. CTP suggests only a small volume of possible ischemia in the posterior left circulation, which seems not to correlate to the patient's presentation.  MRI head No evidence for acute stroke.  Chronic changes as described.  MRA head CTA obtained  Carotid Doppler  CTA Neck  2D Echo  Pending  LDL 68  HgbA1c 6.2%  SCDs for VTE prophylaxis, can resume enoxaparin tmrw AM Diet NPO time specified  aspirin 81 mg daily and clopidogrel 75 mg daily prior to admission, now on aspirin 325 mg  Patient counseled to  be compliant with his antithrombotic medications  Ongoing aggressive stroke risk factor management  Therapy recommendations:  Pending  Disposition:  pending  Hypertension  Stable Control SBP < 160 24 hours post-tPA Long-term BP goal normotensive <130/90  Hyperlipidemia  Home meds:  Simvastatin 10mg , not currentl resumed in hospital  LDL 68, goal < 70  Continue statin at discharge  Diabetes  HgbA1c 6.2%, goal < 7.0  Controlled  Other Stroke Risk Factors  Advanced age  Obesity, Body mass index is 32.92 kg/m., recommend weight loss, diet and exercise as appropriate   Hx stroke/TIA  Migraines  Other Active Problems   Hospital day # 1  I have personally examined this patient, reviewed notes, independently viewed imaging studies, participated in medical decision making and plan of care.ROS completed by me personally and pertinent positives fully documented  I have made any additions or clarifications directly to the above note. He  presented with garbled speech and expressive aphasia likely due to a small left MCA branch infarct and received IV tPA enhancement significant improvement.Imaging study did not show any acute infarct. Continue aspirin and ongoing stroke workup. Continue strict blood pressure control and close neurological monitoring as per post TPA protocol. No family available at the bedside for discussion. This patient is critically ill and at significant risk of neurological worsening, death and care requires constant monitoring of vital signs, hemodynamics,respiratory and cardiac monitoring, extensive review of multiple databases, frequent neurological assessment, discussion with family, other specialists and medical decision making of high complexity.I have made any additions or clarifications directly to the above note.This critical care time does not reflect procedure time, or teaching time or supervisory time of PA/NP/Med Resident etc but could involve care discussion time.  I spent 30 minutes of neurocritical care time  in the care of  this patient.      Delia HeadyPramod Darriel Utter, MD Medical Director [facility] Pager: [phone] 04/25/2017 3:35 PM   To contact Stroke Continuity provider, please refer to [email or web address]. After hours, contact General Neurology

## 2017-04-25 NOTE — Progress Notes (Signed)
OT Cancellation Note  Patient Details Name: Eugene Dudley MRN: [id number] DOB: 09/05/1940   Cancelled Treatment:    Reason Eval/Treat Not Completed: Patient not medically ready. Pt with bedrest orders s/p tPA infusion. Will await increase in activity orders prior to initiating OT evaluation. Thank you for this referral!  Doristine Sectionharity A Amyla Heffner, MS OTR/L  Pager: [phone]   [facility] SystemCharity A Kysen Wetherington 04/25/2017, 6:48 AM

## 2017-04-25 NOTE — Evaluation (Signed)
Speech Language Pathology Evaluation Patient Details Name: Eugene Dudley MRN: [id number] DOB: 10/14/1940 Today's Date: 04/25/2017 Time: [id number] SLP Time Calculation (min) (ACUTE ONLY): 15 min  Problem List:  Patient Active Problem List   Diagnosis Date Noted  . CVA (cerebral vascular accident) (HCC) 04/24/2017  . 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 Stroke (HCC) 02/03/2015  . 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 Essential hypertension 02/03/2015  . 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 HLD (hyperlipidemia) 02/03/2015  . Acute CVA (cerebrovascular accident) (HCC) 11/16/2014  . HTN (hypertension), benign 11/16/2014  . Hyperlipidemia 11/16/2014  . DM (diabetes mellitus) (HCC) 11/16/2014   Past Medical History:  Past Medical History:  Diagnosis Date  . Diabetes mellitus without complication (HCC)   . Hypercholesterolemia   . Hypertension   . Swollen ankles   . TIA (transient ischemic attack) 2011   Past Surgical History:  Past Surgical History:  Procedure Laterality Date  . CATARACT EXTRACTION Left 2012  . CYSTOSCOPY  2015   [facility]  . HERNIA REPAIR    . TONSILLECTOMY     HPI:  76 y.o. male with PMH of DM, HLD, HTN, and stroke (left temporal infarct 11/2014) on ASA and Plavix, admitted with MS changes, dysarthria/aphasia.  tPA administered.  Pt lives alone with support from [place]; he is a widower with sedentary lifestyle; sleeps during the day and is awake watching tv at night.    Assessment / Plan / Recommendation Clinical Impression  Pt presents with baseline, mild speech dysfluency (present after 11/2014 CVA), with frequent pausing for word retrieval.  Dysnomia often self-corrected, but is more problematic with more complex topics.  Pt and step son state that he is approaching baseline (90%). Presents with preserved repetition, command-following, responsive/confrontational naming; pt with good use of  humor.  Describes battling "a little" depression since his wife passed away in 2013.  Acute SLP f/u not indicated at this time.  Our services will sign off.     SLP Assessment  SLP Recommendation/Assessment: Patient does not need any further Speech Lanaguage Pathology Services SLP Visit Diagnosis: Dysphagia, unspecified (R13.10)    Follow Up Recommendations  None    Frequency and Duration           SLP Evaluation Cognition  Overall Cognitive Status: History of cognitive impairments - at baseline Arousal/Alertness: Awake/alert Orientation Level: Oriented X4 Attention: Sustained Sustained Attention: Appears intact       Comprehension  Auditory Comprehension Overall Auditory Comprehension: Appears within functional limits for tasks assessed Visual Recognition/Discrimination Discrimination: Within Function Limits Reading Comprehension Reading Status: Not tested    Expression Expression Primary Mode of Expression: Verbal Verbal Expression Overall Verbal Expression: Impaired at baseline (mild dysfluency and word retrieval deficits) Initiation: No impairment Level of Generative/Spontaneous Verbalization: Conversation Repetition: No impairment Naming: Impairment Responsive: 76-100% accurate Confrontation: Within functional limits Divergent: 75-100% accurate Verbal Errors: Aware of errors Pragmatics: No impairment Written Expression Dominant Hand: Right   Oral / Motor  Oral Motor/Sensory Function Overall Oral Motor/Sensory Function: Within functional limits Motor Speech Overall Motor Speech: Appears within functional limits for tasks assessed   GO                    Eugene Dudley, Eugene Dudley 04/25/2017, 3:58 PM

## 2017-04-25 NOTE — Evaluation (Signed)
Physical Therapy Evaluation Patient Details Name: Eugene Dudley MRN: [id number] DOB: 1941/04/30 Today's Date: 04/25/2017   History of Present Illness  76 y.o. male admitted on 04/24/17 for difficulty speaking.  Pt s/p tPA.  CT was negative.  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 Pt with significant PMH of COPD, TIA in 2011, chronically swollen ankles, HTN, and DM.  Clinical Impression  Pt with some mild unsteadiness during gait.  PT advised him to use one of his canes for steadiness at discharge.  He also seems to have some cognitive and speech difficulties.   PT to follow acutely for deficits listed below.       Follow Up Recommendations Outpatient PT    Equipment Recommendations  None recommended by PT    Recommendations for Other Services   NA    Precautions / Restrictions Precautions Precautions: Fall Precaution Comments: mildly unsteady on his feet.       Mobility  Bed Mobility Overal bed mobility: Needs Assistance Bed Mobility: Supine to Sit     Supine to sit: Min assist     General bed mobility comments: Min hand held assist to pull up to sitting EOB.   Transfers Overall transfer level: Needs assistance Equipment used: None Transfers: Sit to/from Stand Sit to Stand: Min assist         General transfer comment: Min assist to stand from bed for balance.   Ambulation/Gait Ambulation/Gait assistance: Min guard Ambulation Distance (Feet): 130 Feet Assistive device: 1 person hand held assist (and IV pole) Gait Pattern/deviations: Step-through pattern;Staggering left;Staggering right     General Gait Details: pt with mildly staggering gait pattern, assist needed for balance and pt holding to therapist and IV pole.        Modified Rankin (Stroke Patients Only) Modified Rankin (Stroke Patients Only) Pre-Morbid Rankin Score: No symptoms Modified Rankin: Moderately severe disability     Balance Overall balance assessment: Needs assistance Sitting-balance support:  Feet supported;No upper extremity supported Sitting balance-Leahy Scale: Good     Standing balance support: No upper extremity supported Standing balance-Leahy Scale: Fair                                    Home Living Family/patient expects to be discharged to:: Private residence Living Arrangements: Alone Available Help at Discharge: Family;Other (Comment) (family calls daily to check on him) Type of Home: House Home Access: Stairs to enter Entrance Stairs-Rails: Right Entrance Stairs-Number of Steps: 4 Home Layout: One level Home Equipment: Cane - single point      Prior Function Level of Independence: Independent         Comments: still drives, what cooking and cleaning gets done he does.      Hand Dominance   Dominant Hand: Right    Extremity/Trunk Assessment   Upper Extremity Assessment Upper Extremity Assessment: Defer to OT evaluation    Lower Extremity Assessment Lower Extremity Assessment: Overall WFL for tasks assessed (strength 5/5, coordination intact HTS, sensation WNL)    Cervical / Trunk Assessment Cervical / Trunk Assessment: Normal  Communication   Communication: Expressive difficulties  Cognition Arousal/Alertness: Awake/alert Behavior During Therapy: WFL for tasks assessed/performed Overall Cognitive Status: Impaired/Different from baseline Area of Impairment: Memory;Awareness;Problem solving;Following commands                     Memory: Decreased short-term memory Following Commands: Follows multi-step commands inconsistently;Follows multi-step commands with increased  time   Awareness: Emergent Problem Solving: Difficulty sequencing;Requires verbal cues;Requires tactile cues General Comments: Pt with difficulty with word recall, memory.  Unable to produce 2018 even when given multiple choices.  Seems to have word retrieval issues.  Cannot tell me how he would get home from [place] (telling me to turn the wrong  way off of the exit).  I advised him to have someone with him the first time he drives to make sure he is safe (directionally).  He did not feel this was necessary.              Assessment/Plan    PT Assessment Patient needs continued PT services  PT Problem List Decreased activity tolerance;Decreased balance;Decreased mobility;Decreased cognition;Decreased knowledge of use of DME;Decreased safety awareness;Decreased knowledge of precautions;Obesity       PT Treatment Interventions DME instruction;Gait training;Stair training;Functional mobility training;Therapeutic activities;Therapeutic exercise;Balance training;Neuromuscular re-education;Cognitive remediation;Patient/family education    PT Goals (Current goals can be found in the Care Plan section)  Acute Rehab PT Goals Patient Stated Goal: to go home PT Goal Formulation: With patient Time For Goal Achievement: 05/09/17 Potential to Achieve Goals: Good    Frequency Min 4X/week   Barriers to discharge Decreased caregiver support Lives alone       AM-PAC PT "6 Clicks" Daily Activity  Outcome Measure Difficulty turning over in bed (including adjusting bedclothes, sheets and blankets)?: None Difficulty moving from lying on back to sitting on the side of the bed? : Unable Difficulty sitting down on and standing up from a chair with arms (e.g., wheelchair, bedside commode, etc,.)?: Unable Help needed moving to and from a bed to chair (including a wheelchair)?: A Little Help needed walking in hospital room?: A Little Help needed climbing 3-5 steps with a railing? : A Little 6 Click Score: 15    End of Session Equipment Utilized During Treatment: Gait belt Activity Tolerance: Patient tolerated treatment well Patient left: in chair Nurse Communication: Mobility status PT Visit Diagnosis: Unsteadiness on feet (R26.81);Other symptoms and signs involving the nervous system (R29.898);Difficulty in walking, not elsewhere classified  (R26.2)    Time: [id number] PT Time Calculation (min) (ACUTE ONLY): 41 min   Charges:         Lurena Joinerebecca B. Anayah Arvanitis, PT, DPT [phone]   PT Evaluation $PT Eval Moderate Complexity: 1 Mod PT Treatments $Gait Training: 8-22 mins $Therapeutic Activity: 8-22 mins   04/25/2017, 10:39 PM

## 2017-04-25 NOTE — Progress Notes (Signed)
Pt received from 4N with wife at bedside alert, verbal with no noted distress. Pt stable, neuro intact. Telemetry monitoring. Pt oriented to room. Safety measures in place. Call bell within reach. Will continue to monitor.

## 2017-04-25 NOTE — Evaluation (Signed)
Clinical/Bedside Swallow Evaluation Patient Details  Name: Eugene Dudley MRN: [id number] Date of Birth: 12/01/1940  Today's Date: 04/25/2017 Time: SLP Start Time (ACUTE ONLY): 1500 SLP Stop Time (ACUTE ONLY): 1510 SLP Time Calculation (min) (ACUTE ONLY): 10 min  Past Medical History:  Past Medical History:  Diagnosis Date  . Diabetes mellitus without complication (HCC)   . Hypercholesterolemia   . Hypertension   . Swollen ankles   . TIA (transient ischemic attack) 2011   Past Surgical History:  Past Surgical History:  Procedure Laterality Date  . CATARACT EXTRACTION Left 2012  . CYSTOSCOPY  2015   Kidney  . HERNIA REPAIR    . TONSILLECTOMY     HPI:  76 y.o. male with PMH of DM, HLD, HTN, and stroke (left temporal infarct 11/2014) on ASA and Plavix, admitted with MS changes, dysarthria/aphasia.  tPA administered.  Pt lives alone with support from [place]; he is a widower with sedentary lifestyle; sleeps during the day and is awake watching tv at night.    Assessment / Plan / Recommendation Clinical Impression  Pt presents with functional oropharyngeal swallow with adequate mastication, normal pacing, no focal CN deficits, no overt s/s of aspiration.  Pt passed three oz water test without deficit.  Recommend resuming a regular diet, thin liquids; meds in liquid.  No f/u warranted.   SLP Visit Diagnosis: Dysphagia, unspecified (R13.10)    Aspiration Risk  No limitations    Diet Recommendation   regular solids, thin liquids  Medication Administration: Whole meds with liquid    Other  Recommendations Oral Care Recommendations: Oral care BID   Follow up Recommendations None      Frequency and Duration            Prognosis        Swallow Study   General Date of Onset: 04/24/17 HPI: 76 y.o. male with PMH of DM, HLD, HTN, and stroke (left temporal infarct 11/2014) on ASA and Plavix, admitted with MS changes, dysarthria/aphasia.  tPA administered.  Pt lives alone with  support from [place]; he is a widower with sedentary lifestyle; sleeps during the day and is awake watching tv at night.  Type of Study: Bedside Swallow Evaluation Previous Swallow Assessment: no Diet Prior to this Study: NPO Temperature Spikes Noted: No Respiratory Status: Room air History of Recent Intubation: No Behavior/Cognition: Alert;Cooperative;Pleasant mood Oral Cavity Assessment: Within Functional Limits Oral Care Completed by SLP: No Oral Cavity - Dentition: Adequate natural dentition Vision: Functional for self-feeding Self-Feeding Abilities: Able to feed self Patient Positioning: Upright in bed Baseline Vocal Quality: Hoarse Volitional Cough: Strong Volitional Swallow: Able to elicit    Oral/Motor/Sensory Function Overall Oral Motor/Sensory Function: Within functional limits   Ice Chips Ice chips: Within functional limits   Thin Liquid Thin Liquid: Within functional limits    Nectar Thick Nectar Thick Liquid: Not tested   Honey Thick Honey Thick Liquid: Not tested   Puree Puree: Within functional limits   Solid   GO   Solid: Within functional limits        [name], Oneda Duffett Laurice 04/25/2017,3:44 PM

## 2017-04-25 NOTE — Evaluation (Signed)
Occupational Therapy Evaluation Patient Details Name: Eugene Dudley MRN: [id number] DOB: 12/09/1940 Today's Date: 04/25/2017    History of Present Illness 76 y.o. male admitted on 04/24/17 for difficulty speaking.  Pt s/p tPA.  CT was negative.  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 Pt with significant PMH of COPD, TIA in 2011, chronically swollen ankles, HTN, and DM.   Clinical Impression   Pt admitted with above. He demonstrates the below listed deficits and will benefit from continued OT to maximize safety and independence with BADLs.  Pt presents to OT with communication deficits, deficits with alternating/divided attention (which is concerning for driving), impaired balance, and decreased problem solving and safety awareness.  He currently requires min guard - min A for ADLs.     Spoke with family re: need for initial 24 hour supervision, no driving and OPOT.  Son and sister and law will provide recommended supervision.   Recommend pt does not drive until cleared by OPOT and MD - his level of distractibility is concerning in regards to driving.  Will follow.       Follow Up Recommendations  Outpatient OT;Supervision/Assistance - 24 hour    Equipment Recommendations  Tub/shower seat    Recommendations for Other Services       Precautions / Restrictions Precautions Precautions: Fall Precaution Comments: mildly unsteady on his feet.       Mobility Bed Mobility Overal bed mobility: Needs Assistance Bed Mobility: Supine to Sit;Sit to Supine     Supine to sit: Min assist Sit to supine: Supervision   General bed mobility comments: Pt reports the foot of his bed is slightly elevated at home.  Attempted to simulate this in hospital.  Pt required min A to move into sitting position   Transfers Overall transfer level: Needs assistance   Transfers: Sit to/from Stand;Stand Pivot Transfers Sit to Stand: Min assist Stand pivot transfers: Min guard       General transfer comment: Min a to  stand on uneven surface.      Balance Overall balance assessment: Needs assistance Sitting-balance support: Feet supported Sitting balance-Leahy Scale: Good     Standing balance support: No upper extremity supported Standing balance-Leahy Scale: Fair Standing balance comment: requires min guard assist                            ADL either performed or assessed with clinical judgement   ADL Overall ADL's : Needs assistance/impaired Eating/Feeding: Independent   Grooming: Wash/dry hands;Wash/dry face;Oral care;Brushing hair;Min guard;Standing   Upper Body Bathing: Set up;Sitting   Lower Body Bathing: Minimal assistance;Sit to/from stand   Upper Body Dressing : Set up;Sitting   Lower Body Dressing: Minimal assistance;Sit to/from stand   Toilet Transfer: Min guard;Ambulation;Comfort height toilet;Grab bars   Toileting- Clothing Manipulation and Hygiene: Min guard;Sit to/from stand       Functional mobility during ADLs: Min guard;Minimal assistance General ADL Comments: Pt initially requiring min A for balance.  And loses balance to the Lt when distracted, although does recover without assist      Vision Baseline Vision/History: Wears glasses Wears Glasses: At all times Patient Visual Report: No change from baseline Vision Assessment?: Yes Eye Alignment: Within Functional Limits Ocular Range of Motion: Within Functional Limits Tracking/Visual Pursuits: Able to track stimulus in all quads without difficulty Visual Fields: No apparent deficits     Perception Perception Perception Tested?: Yes   Praxis Praxis Praxis tested?: Within functional limits  Pertinent Vitals/Pain Pain Assessment: No/denies pain     Hand Dominance Right   Extremity/Trunk Assessment Upper Extremity Assessment Upper Extremity Assessment: Overall WFL for tasks assessed   Lower Extremity Assessment Lower Extremity Assessment: Defer to PT evaluation   Cervical / Trunk  Assessment Cervical / Trunk Assessment: Normal   Communication Communication Communication: Expressive difficulties   Cognition Arousal/Alertness: Awake/alert Behavior During Therapy: Impulsive;WFL for tasks assessed/performed Overall Cognitive Status: Impaired/Different from baseline Area of Impairment: Attention;Safety/judgement;Problem solving                   Current Attention Level: Selective (cues to alternate and divide attention )   Following Commands: Follows multi-step commands inconsistently;Follows multi-step commands with increased time   Awareness: Emergent Problem Solving: Difficulty sequencing;Requires verbal cues General Comments: Pt easily distracted during tasks.  He will redirect himself back to task, but after a 5-15 second delay which is concerning for driving.  When he initially stood, he stood on seizure mat on the floor causing LOB, he proceeded to continue to stumble on the mat, trying to locate what it was under him that was causing his balance loss, instead of stabilizing himself to prevent fall - OT had to intervene to prevent full LOB that would have potentially led to a fall.     He was unable to generate his medication list, but is able to tell me he takes all 10 meds one time a day at night.   He was impulsive requiring min cues for safety.  He was able to find his room without cues after ambulating on the unit    General Comments  Family present.  Spoke with son, pt and sister in law re: recoommendation for someone to stay with him 24 hours/day initially at discharge to ensure he transitions safely back to baseline.  Also discussed recommendation to not drive until cleared, and recommendation for OPOT     Exercises     Shoulder Instructions      Home Living Family/patient expects to be discharged to:: Private residence Living Arrangements: Alone Available Help at Discharge: Family;Other (Comment) (family calls daily to check on him) Type of  Home: House Home Access: Stairs to enter [employer] of Steps: 4 Entrance Stairs-Rails: Right Home Layout: One level     Bathroom Shower/Tub: Chief Strategy Officer: Handicapped height Bathroom Accessibility: Yes   Home Equipment: [place] - single point      Lives With: Alone;Other (Comment) (stepson does grocery shopping for him)    Prior Functioning/Environment Level of Independence: Independent        Comments: Pt reports he was independent with all ADLs. Son grocery shops for him, he states he doesn't cook.  Pays his bills with credit cards, for the most part, drives. Does not do yard work         OT Problem List: Impaired balance (sitting and/or standing);Decreased cognition;Decreased safety awareness      OT Treatment/Interventions: Self-care/ADL training;Neuromuscular education;DME and/or AE instruction;Therapeutic activities;Cognitive remediation/compensation;Patient/family education;Balance training    OT Goals(Current goals can be found in the care plan section) Acute Rehab OT Goals Patient Stated Goal: to go home  OT Goal Formulation: With patient/family Time For Goal Achievement: 05/02/17 Potential to Achieve Goals: Good ADL Goals Pt Will Perform Grooming: with modified independence;standing Pt Will Perform Upper Body Bathing: with modified independence;sitting Pt Will Perform Lower Body Bathing: with modified independence;sit to/from stand Pt Will Perform Upper Body Dressing: with modified independence;sitting Pt Will Perform  Lower Body Dressing: with modified independence;sit to/from stand Pt Will Transfer to Toilet: with modified independence;ambulating;regular height toilet Pt Will Perform Toileting - Clothing Manipulation and hygiene: with modified independence;sit to/from stand Additional ADL Goal #1: Pt will perform simulated financial management task with supervision  Additional ADL Goal #2: Pt will be able to alternate and divide  attention without cues during path finding activity   OT Frequency: Min 2X/week   Barriers to D/C:            Co-evaluation              AM-PAC PT "6 Clicks" Daily Activity     Outcome Measure Help from another person eating meals?: None Help from another person taking care of personal grooming?: A Little Help from another person toileting, which includes using toliet, bedpan, or urinal?: A Little Help from another person bathing (including washing, rinsing, drying)?: A Little Help from another person to put on and taking off regular upper body clothing?: A Little Help from another person to put on and taking off regular lower body clothing?: A Little 6 Click Score: 19   End of Session Nurse Communication: Mobility status  Activity Tolerance: Patient tolerated treatment well Patient left: in bed;with call bell/phone within reach;with family/visitor present  OT Visit Diagnosis: Unsteadiness on feet (R26.81);Cognitive communication deficit (R41.841) Symptoms and signs involving cognitive functions: Cerebral infarction                Time: 1525-1610 OT Time Calculation (min): 45 min Charges:  OT General Charges $OT Visit: 1 Visit OT Evaluation $OT Eval Moderate Complexity: 1 Mod OT Treatments $Self Care/Home Management : 8-22 mins $Therapeutic Activity: 8-22 mins G-Codes:     [employer], OTR/L [phone]   Jeani Hawking M 04/25/2017, 4:36 PM

## 2017-04-25 NOTE — Progress Notes (Signed)
  Echocardiogram 2D Echocardiogram has been performed.  Nolon RodBrown, Tony 04/25/2017, 3:30 PM

## 2017-04-26 ENCOUNTER — Encounter (HOSPITAL_COMMUNITY)
Admission: EM | Disposition: A | Discharge status: Home / Self Care | Payer: Self-pay | Source: Home / Self Care | Attending: Neurology

## 2017-04-26 ENCOUNTER — Encounter (HOSPITAL_COMMUNITY): Payer: Self-pay

## 2017-04-26 HISTORY — PX: LOOP RECORDER INSERTION: EP1214

## 2017-04-26 LAB — GLUCOSE, CAPILLARY
GLUCOSE-CAPILLARY: 104 mg/dL — AB (ref 65–99)
Glucose-Capillary: 124 mg/dL — ABNORMAL HIGH (ref 65–99)

## 2017-04-26 SURGERY — LOOP RECORDER INSERTION
Anesthesia: LOCAL

## 2017-04-26 MED ORDER — LIDOCAINE-EPINEPHRINE 1 %-1:100000 IJ SOLN
INTRAMUSCULAR | Status: AC
  Filled 2017-04-26: qty 1

## 2017-04-26 MED ORDER — LIDOCAINE-EPINEPHRINE 1 %-1:100000 IJ SOLN
INTRAMUSCULAR | Status: DC | PRN
  Administered 2017-04-26: 30 mL

## 2017-04-26 MED ORDER — PANTOPRAZOLE SODIUM 40 MG PO TBEC: 40.0000 mg | DELAYED_RELEASE_TABLET | Freq: Every day | ORAL | Status: DC

## 2017-04-26 MED ORDER — ASPIRIN EC 325 MG PO TBEC: 325.0000 mg | DELAYED_RELEASE_TABLET | Freq: Every day | ORAL | 3 refills | Status: AC

## 2017-04-26 SURGICAL SUPPLY — 2 items
LOOP REVEAL LINQSYS (Prosthesis & Implant Heart) ×2 IMPLANT
PACK LOOP INSERTION (CUSTOM PROCEDURE TRAY) ×2 IMPLANT

## 2017-04-26 NOTE — Progress Notes (Signed)
OT Cancellation Note    04/26/17 1500  OT Visit Information  Last OT Received On 04/26/17  Reason Eval/Treat Not Completed Patient at procedure or test/ unavailable (cath lab)  [facility] Toria Monte, OT/L  [phone] 04/26/2017

## 2017-04-26 NOTE — Progress Notes (Signed)
Physical Therapy Treatment Patient Details Name: Eugene Dudley MRN: [id number] DOB: [birth year] Today's Date: 04/26/2017    History of Present Illness 76 y.o. male admitted on 04/24/17 for difficulty speaking.  Pt s/p tPA.  CT was negative.  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 Pt with significant PMH of COPD, TIA in 2011, chronically swollen ankles, HTN, and DM.    PT Comments    Pt making good progress towards achieving his current functional mobility goals. PT will continue to follow acutely to ensure a safe d/c home.   Follow Up Recommendations  Outpatient PT     Equipment Recommendations  None recommended by PT    Recommendations for Other Services       Precautions / Restrictions Precautions Precautions: Fall Restrictions Weight Bearing Restrictions: No    Mobility  Bed Mobility Overal bed mobility: Needs Assistance Bed Mobility: Supine to Sit;Sit to Supine     Supine to sit: Min assist Sit to supine: Supervision   General bed mobility comments: Min hand held assist to pull up to sitting EOB.   Transfers Overall transfer level: Needs assistance Equipment used: None Transfers: Sit to/from Stand Sit to Stand: Supervision         General transfer comment: supervision for safety  Ambulation/Gait Ambulation/Gait assistance: Min guard Ambulation Distance (Feet): 200 Feet Assistive device: None Gait Pattern/deviations: Step-through pattern;Decreased stride length Gait velocity: decreased   General Gait Details: mild instability but no overt LOB, min guard for safety; pt with greatly improved gait this session   Stairs            Wheelchair Mobility    Modified Rankin (Stroke Patients Only) Modified Rankin (Stroke Patients Only) Pre-Morbid Rankin Score: No symptoms Modified Rankin: Moderate disability     Balance Overall balance assessment: Needs assistance Sitting-balance support: Feet supported;No upper extremity supported Sitting balance-Leahy Scale:  Good     Standing balance support: No upper extremity supported Standing balance-Leahy Scale: Fair               High level balance activites: Side stepping;Backward walking;Direction changes;Turns;Head turns High Level Balance Comments: min guard for safety with all higher level balance tasks, but no overt LOB or significant instability            Cognition Arousal/Alertness: Awake/alert Behavior During Therapy: WFL for tasks assessed/performed Overall Cognitive Status: Impaired/Different from baseline Area of Impairment: Memory;Safety/judgement;Problem solving                     Memory: Decreased short-term memory   Safety/Judgement: Decreased awareness of safety;Decreased awareness of deficits   Problem Solving: Difficulty sequencing;Requires verbal cues;Requires tactile cues        Exercises      General Comments        Pertinent Vitals/Pain Pain Assessment: No/denies pain    Home Living                      Prior Function            PT Goals (current goals can now be found in the care plan section) Acute Rehab PT Goals PT Goal Formulation: With patient Time For Goal Achievement: 05/09/17 Potential to Achieve Goals: Good Progress towards PT goals: Progressing toward goals    Frequency    Min 4X/week      PT Plan Current plan remains appropriate    Co-evaluation              AM-PAC PT "  6 Clicks" Daily Activity  Outcome Measure  Difficulty turning over in bed (including adjusting bedclothes, sheets and blankets)?: None Difficulty moving from lying on back to sitting on the side of the bed? : Unable Difficulty sitting down on and standing up from a chair with arms (e.g., wheelchair, bedside commode, etc,.)?: A Little Help needed moving to and from a bed to chair (including a wheelchair)?: A Little Help needed walking in hospital room?: A Little Help needed climbing 3-5 steps with a railing? : A Little 6 Click  Score: 17    End of Session Equipment Utilized During Treatment: Gait belt Activity Tolerance: Patient tolerated treatment well Patient left: in bed;with call bell/phone within reach;with bed alarm set;with family/visitor present Nurse Communication: Mobility status PT Visit Diagnosis: Unsteadiness on feet (R26.81);Other symptoms and signs involving the nervous system (R29.898);Difficulty in walking, not elsewhere classified (R26.2)     Time: [id number] PT Time Calculation (min) (ACUTE ONLY): 23 min  Charges:  $Gait Training: 8-22 mins $Therapeutic Activity: 8-22 mins                    G Codes:       [place] HillsJennifer Quame [place], [place]PT, [place]PT [phone]    Alessandra BevelsJennifer M Dewan Emond 04/26/2017, 1:20 PM

## 2017-04-26 NOTE — Discharge Summary (Signed)
Stroke Discharge Summary  Patient ID: oaklen thiam   MRN: [id number]      DOB: [birth year]  Date of Admission: 04/24/2017 Date of Discharge: 04/26/2017  Attending Physician:  Micki Riley, MD, Stroke MD Consultant(s):   Treatment Team:  Hillis Range, MD electrophysiology Patient's PCP:  System, Provider Not In  DISCHARGE DIAGNOSIS:  Principal Problem:   CVA (cerebral vascular accident) [facility]). 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 Diabetes mellitus without complication (HCC)   . Hypercholesterolemia   . Hypertension   . Swollen ankles   . TIA (transient ischemic attack) 2011   Past Surgical History:  Procedure Laterality Date  . CATARACT EXTRACTION Left 2012  . CYSTOSCOPY  2015   Kidney  . HERNIA REPAIR    . TONSILLECTOMY      Allergies as of 04/26/2017      Reactions   Omeprazole       Medication List    STOP taking these medications   aspirin 81 MG tablet Replaced by:  aspirin EC 325 MG tablet   clopidogrel 75 MG tablet Commonly known as:  PLAVIX   ibuprofen 800 MG tablet Commonly known as:  ADVIL,MOTRIN     TAKE these medications   alfuzosin 10 MG 24 hr tablet Commonly known as:  UROXATRAL Take 10 mg by mouth at bedtime.   aspirin EC 325 MG tablet Take 1 tablet (325 mg total) by mouth daily. Replaces:  aspirin 81 MG tablet   Cholecalciferol 1000 units capsule Take 2,000 Units by mouth 2 (two) times daily.   clonazePAM 1 MG tablet Commonly known as:  KLONOPIN Take 0.5 mg by mouth 3 (three) times daily as needed for anxiety.   Fish Oil 1000 MG Caps Take 1,000 mg by mouth at bedtime.   GENTEAL OP Apply 1 drop to eye as needed.   lisinopril 10 MG tablet Commonly known as:  PRINIVIL,ZESTRIL Take 5 mg by mouth at  bedtime.   metFORMIN 500 MG tablet Commonly known as:  GLUCOPHAGE Take 500 mg by mouth daily.   pantoprazole 40 MG tablet Commonly known as:  PROTONIX Take 40 mg by mouth 2 (two) times daily.   PARoxetine 40 MG tablet Commonly known as:  PAXIL Take 1 tablet (40 mg total) by mouth at bedtime.   simvastatin 10 MG tablet Commonly known as:  ZOCOR Take 1 tablet (10 mg total) by mouth at bedtime.       LABORATORY STUDIES CBC    Component Value Date/Time   WBC 11.2 (H) 04/24/2017 1155   RBC 4.69 04/24/2017 1155   HGB 16.0 04/24/2017 1205   HCT 47.0 04/24/2017 1205   PLT 159 04/24/2017 1155   MCV 95.9 04/24/2017 1155   MCH 31.8 04/24/2017 1155   MCHC 33.1 04/24/2017 1155   RDW 14.7 04/24/2017 1155   LYMPHSABS 2.0 04/24/2017 1155   MONOABS 0.5 04/24/2017 1155   EOSABS 0.2 04/24/2017 1155   BASOSABS 0.0 04/24/2017 1155   CMP    Component Value Date/Time   NA 140 04/24/2017 1205   K 4.0 04/24/2017 1205   CL 103 04/24/2017 1205   CO2 27 04/24/2017 1155   GLUCOSE 176 (H) 04/24/2017 1205   BUN 21 (H) 04/24/2017 1205   CREATININE  1.10 04/24/2017 1205   CALCIUM 9.1 04/24/2017 1155   PROT 7.3 04/24/2017 1155   ALBUMIN 3.9 04/24/2017 1155   AST 23 04/24/2017 1155   ALT 20 04/24/2017 1155   ALKPHOS 72 04/24/2017 1155   BILITOT 0.7 04/24/2017 1155   GFRNONAA 53 (L) 04/24/2017 1155   GFRAA >60 04/24/2017 1155   COAGS Lab Results  Component Value Date   INR 0.95 04/24/2017   INR 1.05 11/16/2014   Lipid Panel    Component Value Date/Time   CHOL 131 04/25/2017 0303   TRIG 145 04/25/2017 0303   HDL 34 (L) 04/25/2017 0303   CHOLHDL 3.9 04/25/2017 0303   VLDL 29 04/25/2017 0303   LDLCALC 68 04/25/2017 0303   HgbA1C  Lab Results  Component Value Date   HGBA1C 6.2 (H) 04/25/2017   Urinalysis    Component Value Date/Time   COLORURINE YELLOW 04/24/2017 1326   APPEARANCEUR CLEAR 04/24/2017 1326   LABSPEC >1.046 (H) 04/24/2017 1326   PHURINE 7.0 04/24/2017 1326    GLUCOSEU NEGATIVE 04/24/2017 1326   HGBUR MODERATE (A) 04/24/2017 1326   BILIRUBINUR NEGATIVE 04/24/2017 1326   KETONESUR NEGATIVE 04/24/2017 1326   PROTEINUR 30 (A) 04/24/2017 1326   NITRITE NEGATIVE 04/24/2017 1326   LEUKOCYTESUR NEGATIVE 04/24/2017 1326   Urine Drug Screen     Component Value Date/Time   LABOPIA NONE DETECTED 04/24/2017 1326   COCAINSCRNUR NONE DETECTED 04/24/2017 1326   LABBENZ NONE DETECTED 04/24/2017 1326   AMPHETMU NONE DETECTED 04/24/2017 1326   THCU NONE DETECTED 04/24/2017 1326   LABBARB NONE DETECTED 04/24/2017 1326    Alcohol Level    Component Value Date/Time   ETH <10 04/24/2017 1833     SIGNIFICANT DIAGNOSTIC STUDIES Dg Chest Portable 1 View 04/24/2017 1. Cardiomegaly with pulmonary vascular congestion and interstitial edema. 2. Low lung volumes with bibasilar opacities, likely atelectasis.   Ct Head Code Stroke Wo Contrast 04/24/2017 1. Increased nonspecific cerebral white matter hypodensity since 2016, but otherwise stable with no acute hemorrhage or cortically based infarct identified. 2. ASPECTS is 10.  Ct Angio Head W Or Wo Contrast Ct Angio Neck W Or Wo Contrast Ct Cerebral Perfusion W Contrast 04/24/2017 1. Negative for emergent large vessel occlusion. 2. No core infarct detected by CT perfusion. CTP suggests only a small volume of possible ischemia in the posterior left circulation, which seems not to correlate to the patient's presentation. 3. CTA is positive for intermittent vessel tortuosity, but negative for significant arterial stenosis in the head or neck. Very mild for age atherosclerosis in the neck.  [name] 04/25/2017 No evidence for acute stroke.  Chronic changes as described. No apparent tPA complications.  2D Echocardiogram 04/25/2017 LVEF 55-60% Normal LV systolic function; mild LVH; mild diastolic dysfunction; sclerotic aortic valve. Mild calcified MV.    HISTORY OF PRESENT ILLNESS Eugene Salvolan R  Dudley is an 76 y.o. male presenting to the hospital via EMS for garbled speech. All history is obtained from EMS and the chart as patient is unable to provide any history. The patient called his sister on the phone at around 10:30 AM, he sounded normal according to report but he said that he wasn't feeling right. There is no details available of what wasn't right at that time. The sister called EMS. When EMS arrived, he was slurring his words, making no sense in terms of his speech. No other focal deficits were noted by EMS. [facility] stroke for evaluation. The patient was unable to provide any history to [place] on the initial encounter. Chart review showed that he was admitted for headache with altered mental status, and an MRI that was done as a part of the evaluation showed a small cortical embolic looking infarcts in the left MCA PCA territory, which was thought to be an incidental finding at that time. According to chart, he is on aspirin and Plavix forstroke prevention and is not on any blood thinners. As far as history could be ascertained by EMS, no major surgeries done in the recent past.e aphasia. HE was brought to [facility] where he was unable to state name, year, showed expressive aphasia and dysarthria. CT head was normal and tPA was administered.  EMS also reported that the patient started vomiting en route.he also complained of headache, unsure how much of the history is unreliable  HOSPITAL COURSE He was admitted and initial imaging suggested posible ischemia in posterior L MCA distribution. He received tPA on 10/16 @1201 . Subsequent MRI did not show acute areas of infarction. He was monitored closely in the Neuro ICU without major complications. He was transferred to the floor and participated well with physical and occupational therapy recommending outpatient follow up. 2D echocardiogram was unremarkable. Initial ischemic pattern was concerning  for embolic mechanism, however TEE was deferred due to a history of tolerating endoscopy poorly. Electrophysiology was consulted for implantable loop recorder.  DISCHARGE EXAM Blood pressure 134/82, pulse 74, temperature 98.8 F (37.1 C), temperature source Oral, resp. rate 19, height 6' (1.829 m), weight 242 lb 11.6 oz (110.1 kg), SpO2 92 %.  General - Well nourished, well developed, in no apparent distress. Cardiovascular - Regular rate and rhythm. Mental Status -  Level of arousal and orientation to time, place, and person were intact. Language demonstrated hesitancy and slowing with word finding, no dysarthria Attention span and concentration were normal. Remote memory was slow but intact  Cranial Nerves II - XII - II - Visual field intact OU. III, IV, VI - Extraocular movements intact. V - Facial sensation intact bilaterally. VII - Facial movement intact bilaterally. VIII - Hearing & vestibular intact bilaterally. X - Palate elevates symmetrically. XI - Chin turning & shoulder shrug intact bilaterally. XII - Tongue protrusion intact.  Motor Strength - The patient's strength was 5/5 in all extremities Motor Tone - Muscle tone was assessed at the neck and appendages and was normal. Reflexes - The patient's reflexes were 1+ in all extremities and he had no pathological reflexes. Sensory - Light touch was assessed was symmetrical.   Coordination - The patient had normal movements in the hands and feet with no ataxia or dysmetria. Gait and Station - Not assessed  Discharge Diet   Diet Carb Modified Fluid consistency: Thin; Room service appropriate? Yes Diet - low sodium heart healthy liquids  DISCHARGE PLAN  Disposition:  Home  aspirin 325 mg daily for secondary stroke prevention. He declines plavix and was evidently not taking this PTA.  Ongoing risk factor control by Primary Care Physician at time of discharge - hypertension management  Follow-up System, Provider Not In  in 2 weeks.  Follow-up with Dr. Delia Heady, Stroke Clinic in 6 weeks, office to schedule an appointment.  Follow up outpatient for ILR wound check  < 30 minutes were spent preparing discharge.  Fuller Plan, MD PGY-III Internal Medicine Resident Pager# [phone] 04/26/2017, 2:05 PM I have personally examined this patient, reviewed notes, independently viewed imaging  studies, participated in medical decision making and plan of care.ROS completed by me personally and pertinent positives fully documented  I have made any additions or clarifications directly to the above note. Agree with note above.  Delia Heady, MD Medical Director [facility] St[facility] Pager: [phone] 04/26/2017 2:33 PM

## 2017-04-26 NOTE — Interval H&P Note (Signed)
History and Physical Interval Note:  04/26/2017 1:43 PM  Eugene Dudley  has presented today for surgery, with the diagnosis of ischemic stroke  The various methods of treatment have been discussed with the patient and family. After consideration of risks, benefits and other options for treatment, the patient has consented to  Procedure(s): LOOP RECORDER INSERTION (N/A) as a surgical intervention .  The patient's history has been reviewed, patient examined, no change in status, stable for surgery.  I have reviewed the patient's chart and labs.  Questions were answered to the patient's satisfaction.     Hillis RangeJames Tinsley Everman

## 2017-04-26 NOTE — Consult Note (Signed)
ELECTROPHYSIOLOGY CONSULT NOTE  Patient ID: Eugene Dudley MRN: [id number], DOB/AGE: 78-29-1942   Admit date: 04/24/2017 Date of Consult: 04/26/2017  Primary Physician: System, Provider Not In Primary Cardiologist: new to [facility] Reason for Consultation: Cryptogenic stroke; recommendations regarding Implantable Loop Recorder  History of Present Illness EP has been asked to evaluate [name] for placement of an implantable loop recorder to monitor for atrial fibrillation by Dr Pearlean Brownie.  The patient was admitted on 04/24/2017 with speech difficulties.  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  He has had prior strokes in the past and worn a 30 day monitor with no arrhythmias identified. He received tPA with resolution of symptoms.  He has undergone workup for stroke including echocardiogram and carotid dopplers.  The patient has been monitored on telemetry which has demonstrated sinus rhythm with no arrhythmias.  TEE is deferred 2/2 issues with swallowing after endoscopy in the past.   Echocardiogram this admission demonstrated EF 55-60%, mild LVH, no RWMA, LA 45.  Lab work is reviewed.  Prior to admission, the patient denies chest pain, shortness of breath, dizziness, palpitations, or syncope.  They are recovering from their stroke with plans to return home at discharge.    Past Medical History:  Diagnosis Date  . Diabetes mellitus without complication (HCC)   . Hypercholesterolemia   . Hypertension   . Swollen ankles   . TIA (transient ischemic attack) 2011     Surgical History:  Past Surgical History:  Procedure Laterality Date  . CATARACT EXTRACTION Left 2012  . CYSTOSCOPY  2015   Kidney  . HERNIA REPAIR    . TONSILLECTOMY       Prescriptions Prior to Admission  Medication Sig Dispense Refill Last Dose  . alfuzosin (UROXATRAL) 10 MG 24 hr tablet Take 10 mg by mouth at bedtime.    Taking  . aspirin 81 MG tablet Take 81 mg by mouth daily.    Taking  . Cholecalciferol 1000 UNITS capsule Take 2,000 Units by mouth 2 (two) times daily.    Taking  . clonazePAM (KLONOPIN) 1 MG tablet Take 0.5 mg by mouth 3 (three) times daily as needed for anxiety.    Taking  . ibuprofen (ADVIL,MOTRIN) 800 MG tablet Take 800 mg by mouth every 8 (eight) hours as needed for mild pain.     . metFORMIN (GLUCOPHAGE) 500 MG tablet Take 500 mg by mouth daily.    Taking  . Omega-3 Fatty Acids (FISH OIL) 1000 MG CAPS Take 1,000 mg by mouth at bedtime.    Taking  . Carboxymethylcell-Hypromellose (GENTEAL OP) Apply 1 drop to eye as needed.    at [facility]  . clopidogrel (PLAVIX) 75 MG tablet Take 1 tablet (75 mg total) by mouth daily. 30 tablet 11  at [facility]  . lisinopril (PRINIVIL,ZESTRIL) 10 MG tablet Take 5 mg by mouth at bedtime.    at [facility]  . pantoprazole (PROTONIX) 40 MG tablet Take 40 mg by mouth 2 (two) times daily.    at [facility]  . PARoxetine (PAXIL) 40 MG tablet Take 1 tablet (40 mg total) by mouth at bedtime. 30 tablet 11  at [facility]  . simvastatin (ZOCOR) 10 MG tablet Take 1 tablet (10 mg total) by mouth at bedtime. 30 tablet 11  at [facility]    Inpatient Medications:  .  stroke: mapping our early stages of recovery book   Does not apply Once  . clonazePAM  0.5 mg Oral QID  .  insulin aspart  0-15 Units Subcutaneous TID WC  . labetalol  20 mg Intravenous Once  . mouth rinse  15 mL Mouth Rinse BID  . pantoprazole  40 mg Oral QHS    Allergies:  Allergies  Allergen Reactions  . Omeprazole     Social History   Social History  . Marital status: Married    Spouse name: N/A  . Number of children: 0  . Years of education: HS   Occupational History  . Retired    Social History Main Topics  . Smoking status: Former Smoker    Packs/day: 2.00    Years: 40.00    Types: Cigarettes    Quit date: 10/09/1999  . Smokeless tobacco: Former Neurosurgeon    Quit date: 10/09/1999  . Alcohol use No  . Drug use: No  . Sexual activity: No   Other Topics Concern  . Not on file    Social History Narrative   Patient lives at home alone.   Caffeine use: 3-4 cups daily     Family History  Problem Relation Age of Onset  . Transient ischemic attack Mother   . Blindness Mother   . Heart murmur Mother   . Aneurysm Father   . Glaucoma Sister   . Glaucoma Maternal Grandfather       Review of Systems: All other systems reviewed and are otherwise negative except as noted above.  Physical Exam: Vitals:   04/25/17 1649 04/25/17 2117 04/26/17 0503 04/26/17 0948  BP: (!) 141/83 (!) 163/85 (!) 151/96 (!) 145/93  Pulse: 71 82 73 74  Resp: 18 18 18 20   Temp: 98.4 F (36.9 C) 98.6 F (37 C) 99 F (37.2 C) 99 F (37.2 C)  TempSrc: Oral Oral Oral Oral  SpO2: 97% 95% 95% 95%  Weight:      Height:        GEN- The patient is elderly appearing, alert and oriented x 3 today.   Head- normocephalic, atraumatic Eyes-  Sclera clear, conjunctiva pink Ears- hearing intact Oropharynx- clear Neck- supple Lungs- Clear to ausculation bilaterally, normal work of breathing Heart- Regular rate and rhythm  GI- soft, NT, ND, + BS Extremities- no clubbing, cyanosis, or edema MS- no significant deformity or atrophy Skin- no rash or lesion Psych- euthymic mood, full affect   Labs:   Lab Results  Component Value Date   WBC 11.2 (H) 04/24/2017   HGB 16.0 04/24/2017   HCT 47.0 04/24/2017   MCV 95.9 04/24/2017   PLT 159 04/24/2017    Recent Labs Lab 04/24/17 1155 04/24/17 1205  NA 139 140  K 4.0 4.0  CL 103 103  CO2 27  --   BUN 18 21*  CREATININE 1.26* 1.10  CALCIUM 9.1  --   PROT 7.3  --   BILITOT 0.7  --   ALKPHOS 72  --   ALT 20  --   AST 23  --   GLUCOSE 175* 176*     Radiology/Studies: Ct Angio Head W Or Wo Contrast Result Date: 04/24/2017 CLINICAL DATA:  76 y/o male presenting with abnormal speech/aphasia but no associated weakness. Status post IV tPA. EXAM: CT ANGIOGRAPHY HEAD AND NECK CT PERFUSION BRAIN TECHNIQUE: Multidetector CT imaging of the  head and neck was performed using the standard protocol during bolus administration of intravenous contrast. Multiplanar CT image reconstructions and MIPs were obtained to evaluate the vascular anatomy. Carotid stenosis measurements (when applicable) are obtained utilizing NASCET criteria, using the distal  internal carotid diameter as the denominator. Multiphase CT imaging of the brain was performed following IV bolus contrast injection. Subsequent parametric perfusion maps were calculated using RAPID software. CONTRAST:  90 mL Isovue 370 COMPARISON:  Noncontrast head CT 1151 hours today. FINDINGS: CT Brain Perfusion Findings: CBF (<30%) Volume: 0 mL. Other less stringent CBF measurements are also negative. Perfusion (Tmax>6.0s) volume: 21 mL, located in the left occipital lobe. Mismatch Volume: 21 mL Infarction Location:  Not applicable CTA NECK Skeleton: Degenerative changes in the cervical spine. Reversal of lordosis. Ankylosis of C2-C3 posterior elements. No acute osseous abnormality identified. Upper chest: Calcified coronary artery atherosclerosis. No superior mediastinal lymphadenopathy. Negative lung apices. Other neck: Partially retropharyngeal course of both carotid arteries. Otherwise negative. No lymphadenopathy. Aortic arch: 3 vessel arch configuration. Minimal calcified arch atherosclerosis, no proximal great vessel stenosis. Right carotid system: Negative right CCA aside from tortuosity. Widely patent retropharyngeal right carotid bifurcation. Tortuous right ICA but no stenosis to the skullbase. Left carotid system: Tortuous left CCA. Mild mostly calcified plaque at the left carotid bifurcation with no stenosis. Tortuous but otherwise negative cervical left ICA. Vertebral arteries: Tortuous proximal right subclavian artery. Normal right vertebral artery origin. Tortuous but otherwise negative cervical right vertebral artery. No proximal left subclavian artery stenosis despite mild plaque. Normal left  vertebral artery origin. Tortuous left V1 segment. The left vertebral artery is mildly dominant and tortuous but otherwise negative to the skullbase. CTA HEAD Posterior circulation: Dominant distal left vertebral artery. Both vertebral arteries seem to taper distal to the PICA origins, but there is no atherosclerotic distal vertebral stenosis identified. Patent vertebrobasilar junction. No basilar stenosis. AICA, SCA and PCA origins are patent. Posterior communicating arteries are diminutive or absent. Bilateral PCA branches are within normal limits. Anterior circulation: Patent ICA siphons with calcified plaque but no significant siphon stenosis. Normal ophthalmic artery origins. Patent carotid termini. Normal MCA and ACA origins. Dominant left A1 segment. Anterior communicating artery and bilateral ACA branches are within normal limits. Median artery of the corpus callosum which is somewhat dominant. Left MCA M1 segment, left MCA trifurcation, and left MCA branches are within normal limits. Right MCA M1 segment, right MCA bifurcation, and right MCA branches are within normal limits. Venous sinuses: Not evaluated due to little venous contrast on these early arterial phase images. Anatomic variants: Dominant left vertebral artery. Dominant left ACA A1 segment. Median artery of the corpus callosum which is dominant. Review of the MIP images confirms the above findings IMPRESSION: 1. Negative for emergent large vessel occlusion. 2. No core infarct detected by CT perfusion. CTP suggests only a small volume of possible ischemia in the posterior left circulation, which seems not to correlate to the patient's presentation. 3. CTA is positive for intermittent vessel tortuosity, but negative for significant arterial stenosis in the head or neck. Very mild for age atherosclerosis in the neck. 4. This study was preliminarily reviewed in person with Dr. Milon DikesASHISH ARORA on 04/24/2017 at 1222 hours. Electronically Signed   By: Odessa FlemingH   Hall M.D.   On: 04/24/2017 13:04    12-lead ECG sinus rhythm (personally reviewed) All prior EKG's in EPIC reviewed with no documented atrial fibrillation  Telemetry sinus rhythm (personally reviewed)  Assessment and Plan:  1. Cryptogenic stroke The patient presents with cryptogenic stroke.  TEE is deferred 2/2 issues after endoscopy in the past.  I spoke at length with the patient about monitoring for afib with an implantable loop recorder.  Risks, benefits, and alteratives to implantable loop  recorder were discussed with the patient today.   At this time, the patient is very clear in their decision to proceed with implantable loop recorder.   Wound care was reviewed with the patient (keep incision clean and dry for 3 days).  Wound check scheduled and entered in AVS.  Please call with questions.   Gypsy Balsam, NP 04/26/2017 12:48 PM  I have seen, examined the patient, and reviewed the above assessment and plan.  Changes to above are made where necessary.  On exam, RRR.  Risks  And benefits of ILR discussed with the patient who wishes to proceed at this time.  Co Sign: Hillis Range, MD 04/26/2017 1:43 PM

## 2017-04-26 NOTE — Progress Notes (Signed)
STROKE TEAM PROGRESS NOTE   SUBJECTIVE (INTERVAL HISTORY) No acute events overnight. He worked well with therapy who recommend outpatient treatment. He is tolerating his diet without complication. Echocardiogram was obtained without new findings. He reports having complications with endoscopy in the past making him concerned about a TEE.he also states that he had a 30 day outpatient heart monitor performed through the [place] clinic in the past which did not show any A. fib  OBJECTIVE CBC:   Recent Labs Lab 04/24/17 1155 04/24/17 1205  WBC 11.2*  --   NEUTROABS 8.5*  --   HGB 14.9 16.0  HCT 45.0 47.0  MCV 95.9  --   PLT 159  --     Basic Metabolic Panel:   Recent Labs Lab 04/24/17 1155 04/24/17 1205  NA 139 140  K 4.0 4.0  CL 103 103  CO2 27  --   GLUCOSE 175* 176*  BUN 18 21*  CREATININE 1.26* 1.10  CALCIUM 9.1  --     Lipid Panel:     Component Value Date/Time   CHOL 131 04/25/2017 0303   TRIG 145 04/25/2017 0303   HDL 34 (L) 04/25/2017 0303   CHOLHDL 3.9 04/25/2017 0303   VLDL 29 04/25/2017 0303   LDLCALC 68 04/25/2017 0303   HgbA1c:  Lab Results  Component Value Date   HGBA1C 6.2 (H) 04/25/2017   Urine Drug Screen:     Component Value Date/Time   LABOPIA NONE DETECTED 04/24/2017 1326   COCAINSCRNUR NONE DETECTED 04/24/2017 1326   LABBENZ NONE DETECTED 04/24/2017 1326   AMPHETMU NONE DETECTED 04/24/2017 1326   THCU NONE DETECTED 04/24/2017 1326   LABBARB NONE DETECTED 04/24/2017 1326    Alcohol Level     Component Value Date/Time   ETH <10 04/24/2017 1833    IMAGING  Dg Chest Portable 1 View 04/24/2017 1. Cardiomegaly with pulmonary vascular congestion and interstitial edema. 2. Low lung volumes with bibasilar opacities, likely atelectasis.   Ct Head Code Stroke Wo Contrast 04/24/2017 1. Increased nonspecific cerebral white matter hypodensity since 2016, but otherwise stable with no acute hemorrhage or cortically based infarct identified. 2.  ASPECTS is 10.  Ct Angio Head W Or Wo Contrast Ct Angio Neck W Or Wo Contrast Ct Cerebral Perfusion W Contrast 04/24/2017 1. Negative for emergent large vessel occlusion. 2. No core infarct detected by CT perfusion. CTP suggests only a small volume of possible ischemia in the posterior left circulation, which seems not to correlate to the patient's presentation. 3. CTA is positive for intermittent vessel tortuosity, but negative for significant arterial stenosis in the head or neck. Very mild for age atherosclerosis in the neck.  [name] 04/25/2017 No evidence for acute stroke.  Chronic changes as described. No apparent tPA complications.  2D Echocardiogram 04/25/2017 LVEF 55-60% Normal LV systolic function; mild LVH; mild diastolic dysfunction; sclerotic aortic valve. Mild calcified MV.  PHYSICAL EXAM  Temp:  [98.4 F (36.9 C)-99 F (37.2 C)] 99 F (37.2 C) (10/18 0948) Pulse Rate:  [66-82] 74 (10/18 0948) Resp:  [15-20] 20 (10/18 0948) BP: (140-163)/(83-96) 145/93 (10/18 0948) SpO2:  [90 %-97 %] 95 % (10/18 0948)  General - Well nourished, well developed, in no apparent distress.  Cardiovascular - Regular rate and rhythm.  Mental Status -  Level of arousal and orientation to time, place, and person were intact. Language demonstrated hesitancy and slowing with word finding, no dysarthria Attention span and concentration were normal. Remote memory was slow  but intact  Cranial Nerves II - XII - II - Visual field intact OU. III, IV, VI - Extraocular movements intact. V - Facial sensation intact bilaterally. VII - Facial movement intact bilaterally. VIII - Hearing & vestibular intact bilaterally. X - Palate elevates symmetrically. XI - Chin turning & shoulder shrug intact bilaterally. XII - Tongue protrusion intact.  Motor Strength - The patient's strength was 5/5 in all extremities  Motor Tone - Muscle tone was assessed at the neck and appendages and was  normal.  Reflexes - The patient's reflexes were 1+ in all extremities and he had no pathological reflexes.  Sensory - Light touch was assessed was symmetrical.    Coordination - The patient had normal movements in the hands and feet with no ataxia or dysmetria.  Gait and Station - Not assessed   ASSESSMENT/PLAN [name] is a 76 y.o. male with history of hypertension, hyperlipidemia, diabetes mellitus, TIA, migraines presenting with slurring speech. He received IV tPA 10/16 @1201 .   Stroke:  Posterior L MCA infarct embolic in pattern without demonstration on   imaging with MRI head  after tPA administration. Unclear if remaining deficits are attributable.  Resultant  Mild expressive aphasia - hesitance and slowing with word finding  CT head Increased nonspecific cerebral white matter hypodensity without visible acute infarct  CTA Head/Neck No core infarct detected by CT perfusion. CTP suggests only a small volume of possible ischemia in the posterior left circulation, which seems not to correlate to the patient's presentation.  MRI head No evidence for acute stroke.  Chronic changes as described.  MRA head CTA obtained  Carotid Doppler  CTA Neck  2D Echo  LVEF 55-60%, no embolic source identified  LDL 68  HgbA1c 6.2%  SCDs for VTE prophylaxis, can resume enoxaparin tmrw AM Diet Carb Modified Fluid consistency: Thin; Room service appropriate? Yes  aspirin 81 mg daily and clopidogrel 75 mg daily prior to admission, now on aspirin 325 mg  Patient counseled to be compliant with his antithrombotic medications  Ongoing aggressive stroke risk factor management  Therapy recommendations:  Outpatient PT/OT  Disposition:  Home  Hypertension  Stable  Long-term BP goal normotensive <130/90  Hyperlipidemia  Home meds:  Simvastatin 10mg , not currently resumed in hospital  LDL 68, goal < 70  Continue statin at discharge  Diabetes  HgbA1c 6.2%, goal <  7.0  Controlled  Other Stroke Risk Factors  Advanced age  Obesity, Body mass index is 32.92 kg/m., recommend weight loss, diet and exercise as appropriate   Hx stroke/TIA  Migraines   Minimal residual deficits and no infarction seen on MRI. This may be from small clot s/p tPA thrombolysis. Suspicious for embolic process due to lack of intracranial disease. The patient has previous complications from endoscopy so recommending loop recorder for occult Afib.  Hospital day # 2  I have personally examined this patient, reviewed notes, independently viewed imaging studies, participated in medical decision making and plan of care.ROS completed by me personally and pertinent positives fully documented  I have made any additions or clarifications directly to the above note. Recommend discharge home after loop recorder on antiplatelet therapy. Follow-up as an outpatient in stroke clinic in 6 weeks. Long discussion with patient and wife and Dr. Johney FrameAllred and answered questions Delia HeadyPramod Sethi, MD Medical Director [facility] Pager: [phone] 04/26/2017 2:32 PM   To contact Stroke Continuity provider, please refer to [email or web address]. After hours, contact General Neurology

## 2017-04-26 NOTE — Progress Notes (Signed)
Patient confused at times - refuses to call for assistance before getting out of the bed to use the restroom.  RN will continue to monitor patient

## 2017-04-26 NOTE — Care Management Note (Signed)
Case Management Note  Patient Details  Name: Eugene Dudley MRN: [id number] Date of Birth: 20-Dec-1940  Subjective/Objective:                    Action/Plan: Plan is for patient to d/c home with self care. CM consulted for outpatient therapy. CM met with the patient and he would like to attend at [facility] main rehab. Orders in EPIC and information on the AVS.  Pt goes to the [facility] and sees Dr Juleen China.  Pt has transportation home.   Expected Discharge Date:                  Expected Discharge Plan:  OP Rehab  In-House Referral:     Discharge planning Services  CM Consult  Post Acute Care Choice:    Choice offered to:  Patient  DME Arranged:    DME Agency:     HH Arranged:    [place] Agency:     Status of Service:  Completed, signed off  If discussed at [employer] of Stay Meetings, dates discussed:    Additional Comments:  Pollie Friar, RN 04/26/2017, 1:20 PM

## 2017-04-26 NOTE — H&P (View-Only) (Signed)
ELECTROPHYSIOLOGY CONSULT NOTE  Patient ID: NASIM HABEEB MRN: [id number], DOB/AGE: 78-29-1942   Admit date: 04/24/2017 Date of Consult: 04/26/2017  Primary Physician: System, Provider Not In Primary Cardiologist: new to [facility] Reason for Consultation: Cryptogenic stroke; recommendations regarding Implantable Loop Recorder  History of Present Illness EP has been asked to evaluate Anise Salvo Jorgensen for placement of an implantable loop recorder to monitor for atrial fibrillation by Dr Pearlean Brownie.  The patient was admitted on 04/24/2017 with speech difficulties.  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  He has had prior strokes in the past and worn a 30 day monitor with no arrhythmias identified. He received tPA with resolution of symptoms.  He has undergone workup for stroke including echocardiogram and carotid dopplers.  The patient has been monitored on telemetry which has demonstrated sinus rhythm with no arrhythmias.  TEE is deferred 2/2 issues with swallowing after endoscopy in the past.   Echocardiogram this admission demonstrated EF 55-60%, mild LVH, no RWMA, LA 45.  Lab work is reviewed.  Prior to admission, the patient denies chest pain, shortness of breath, dizziness, palpitations, or syncope.  They are recovering from their stroke with plans to return home at discharge.    Past Medical History:  Diagnosis Date  . Diabetes mellitus without complication (HCC)   . Hypercholesterolemia   . Hypertension   . Swollen ankles   . TIA (transient ischemic attack) 2011     Surgical History:  Past Surgical History:  Procedure Laterality Date  . CATARACT EXTRACTION Left 2012  . CYSTOSCOPY  2015   [facility]  . HERNIA REPAIR    . TONSILLECTOMY       Prescriptions Prior to Admission  Medication Sig Dispense Refill Last Dose  . alfuzosin (UROXATRAL) 10 MG 24 hr tablet Take 10 mg by mouth at bedtime.    Taking  . aspirin 81 MG tablet Take 81 mg by mouth daily.    Taking  . Cholecalciferol 1000 UNITS capsule Take 2,000 Units by mouth 2 (two) times daily.    Taking  . clonazePAM (KLONOPIN) 1 MG tablet Take 0.5 mg by mouth 3 (three) times daily as needed for anxiety.    Taking  . ibuprofen (ADVIL,MOTRIN) 800 MG tablet Take 800 mg by mouth every 8 (eight) hours as needed for mild pain.     . metFORMIN (GLUCOPHAGE) 500 MG tablet Take 500 mg by mouth daily.    Taking  . Omega-3 Fatty Acids (FISH OIL) 1000 MG CAPS Take 1,000 mg by mouth at bedtime.    Taking  . Carboxymethylcell-Hypromellose (GENTEAL OP) Apply 1 drop to eye as needed.    at [facility]  . clopidogrel (PLAVIX) 75 MG tablet Take 1 tablet (75 mg total) by mouth daily. 30 tablet 11  at [facility]  . lisinopril (PRINIVIL,ZESTRIL) 10 MG tablet Take 5 mg by mouth at bedtime.    at [facility]  . pantoprazole (PROTONIX) 40 MG tablet Take 40 mg by mouth 2 (two) times daily.    at [facility]  . PARoxetine (PAXIL) 40 MG tablet Take 1 tablet (40 mg total) by mouth at bedtime. 30 tablet 11  at [facility]  . simvastatin (ZOCOR) 10 MG tablet Take 1 tablet (10 mg total) by mouth at bedtime. 30 tablet 11  at [facility]    Inpatient Medications:  .  stroke: mapping our early stages of recovery book   Does not apply Once  . clonazePAM  0.5 mg Oral QID  .  insulin aspart  0-15 Units Subcutaneous TID WC  . labetalol  20 mg Intravenous Once  . mouth rinse  15 mL Mouth Rinse BID  . pantoprazole  40 mg Oral QHS    Allergies:  Allergies  Allergen Reactions  . Omeprazole     Social History   Social History  . Marital status: Married    Spouse name: N/A  . Number of children: 0  . Years of education: HS   Occupational History  . Retired    Social History Main Topics  . Smoking status: Former Smoker    Packs/day: 2.00    Years: 40.00    Types: Cigarettes    Quit date: 10/09/1999  . Smokeless tobacco: Former Neurosurgeon    Quit date: 10/09/1999  . Alcohol use No  . Drug use: No  . Sexual activity: No   Other Topics Concern  . Not on file    Social History Narrative   Patient lives at home alone.   Caffeine use: 3-4 cups daily     Family History  Problem Relation Age of Onset  . Transient ischemic attack Mother   . Blindness Mother   . Heart murmur Mother   . Aneurysm Father   . Glaucoma Sister   . Glaucoma Maternal Grandfather       Review of Systems: All other systems reviewed and are otherwise negative except as noted above.  Physical Exam: Vitals:   04/25/17 1649 04/25/17 2117 04/26/17 0503 04/26/17 0948  BP: (!) 141/83 (!) 163/85 (!) 151/96 (!) 145/93  Pulse: 71 82 73 74  Resp: 18 18 18 20   Temp: 98.4 F (36.9 C) 98.6 F (37 C) 99 F (37.2 C) 99 F (37.2 C)  TempSrc: Oral Oral Oral Oral  SpO2: 97% 95% 95% 95%  Weight:      Height:        GEN- The patient is elderly appearing, alert and oriented x 3 today.   Head- normocephalic, atraumatic Eyes-  Sclera clear, conjunctiva pink Ears- hearing intact Oropharynx- clear Neck- supple Lungs- Clear to ausculation bilaterally, normal work of breathing Heart- Regular rate and rhythm  GI- soft, NT, ND, + BS Extremities- no clubbing, cyanosis, or edema MS- no significant deformity or atrophy Skin- no rash or lesion Psych- euthymic mood, full affect   Labs:   Lab Results  Component Value Date   WBC 11.2 (H) 04/24/2017   HGB 16.0 04/24/2017   HCT 47.0 04/24/2017   MCV 95.9 04/24/2017   PLT 159 04/24/2017    Recent Labs Lab 04/24/17 1155 04/24/17 1205  NA 139 140  K 4.0 4.0  CL 103 103  CO2 27  --   BUN 18 21*  CREATININE 1.26* 1.10  CALCIUM 9.1  --   PROT 7.3  --   BILITOT 0.7  --   ALKPHOS 72  --   ALT 20  --   AST 23  --   GLUCOSE 175* 176*     Radiology/Studies: Ct Angio Head W Or Wo Contrast Result Date: 04/24/2017 CLINICAL DATA:  76 y/o male presenting with abnormal speech/aphasia but no associated weakness. Status post IV tPA. EXAM: CT ANGIOGRAPHY HEAD AND NECK CT PERFUSION BRAIN TECHNIQUE: Multidetector CT imaging of the  head and neck was performed using the standard protocol during bolus administration of intravenous contrast. Multiplanar CT image reconstructions and MIPs were obtained to evaluate the vascular anatomy. Carotid stenosis measurements (when applicable) are obtained utilizing NASCET criteria, using the distal  internal carotid diameter as the denominator. Multiphase CT imaging of the brain was performed following IV bolus contrast injection. Subsequent parametric perfusion maps were calculated using RAPID software. CONTRAST:  90 mL Isovue 370 COMPARISON:  Noncontrast head CT 1151 hours today. FINDINGS: CT Brain Perfusion Findings: CBF (<30%) Volume: 0 mL. Other less stringent CBF measurements are also negative. Perfusion (Tmax>6.0s) volume: 21 mL, located in the left occipital lobe. Mismatch Volume: 21 mL Infarction Location:  Not applicable CTA NECK Skeleton: Degenerative changes in the cervical spine. Reversal of lordosis. Ankylosis of C2-C3 posterior elements. No acute osseous abnormality identified. Upper chest: Calcified coronary artery atherosclerosis. No superior mediastinal lymphadenopathy. Negative lung apices. Other neck: Partially retropharyngeal course of both carotid arteries. Otherwise negative. No lymphadenopathy. Aortic arch: 3 vessel arch configuration. Minimal calcified arch atherosclerosis, no proximal great vessel stenosis. Right carotid system: Negative right CCA aside from tortuosity. Widely patent retropharyngeal right carotid bifurcation. Tortuous right ICA but no stenosis to the skullbase. Left carotid system: Tortuous left CCA. Mild mostly calcified plaque at the left carotid bifurcation with no stenosis. Tortuous but otherwise negative cervical left ICA. Vertebral arteries: Tortuous proximal right subclavian artery. Normal right vertebral artery origin. Tortuous but otherwise negative cervical right vertebral artery. No proximal left subclavian artery stenosis despite mild plaque. Normal left  vertebral artery origin. Tortuous left V1 segment. The left vertebral artery is mildly dominant and tortuous but otherwise negative to the skullbase. CTA HEAD Posterior circulation: Dominant distal left vertebral artery. Both vertebral arteries seem to taper distal to the PICA origins, but there is no atherosclerotic distal vertebral stenosis identified. Patent vertebrobasilar junction. No basilar stenosis. AICA, SCA and PCA origins are patent. Posterior communicating arteries are diminutive or absent. Bilateral PCA branches are within normal limits. Anterior circulation: Patent ICA siphons with calcified plaque but no significant siphon stenosis. Normal ophthalmic artery origins. Patent carotid termini. Normal MCA and ACA origins. Dominant left A1 segment. Anterior communicating artery and bilateral ACA branches are within normal limits. Median artery of the corpus callosum which is somewhat dominant. Left MCA M1 segment, left MCA trifurcation, and left MCA branches are within normal limits. Right MCA M1 segment, right MCA bifurcation, and right MCA branches are within normal limits. Venous sinuses: Not evaluated due to little venous contrast on these early arterial phase images. Anatomic variants: Dominant left vertebral artery. Dominant left ACA A1 segment. Median artery of the corpus callosum which is dominant. Review of the MIP images confirms the above findings IMPRESSION: 1. Negative for emergent large vessel occlusion. 2. No core infarct detected by CT perfusion. CTP suggests only a small volume of possible ischemia in the posterior left circulation, which seems not to correlate to the patient's presentation. 3. CTA is positive for intermittent vessel tortuosity, but negative for significant arterial stenosis in the head or neck. Very mild for age atherosclerosis in the neck. 4. This study was preliminarily reviewed in person with Dr. Milon DikesASHISH ARORA on 04/24/2017 at 1222 hours. Electronically Signed   By: Odessa FlemingH   Hall M.D.   On: 04/24/2017 13:04    12-lead ECG sinus rhythm (personally reviewed) All prior EKG's in EPIC reviewed with no documented atrial fibrillation  Telemetry sinus rhythm (personally reviewed)  Assessment and Plan:  1. Cryptogenic stroke The patient presents with cryptogenic stroke.  TEE is deferred 2/2 issues after endoscopy in the past.  I spoke at length with the patient about monitoring for afib with an implantable loop recorder.  Risks, benefits, and alteratives to implantable loop  recorder were discussed with the patient today.   At this time, the patient is very clear in their decision to proceed with implantable loop recorder.   Wound care was reviewed with the patient (keep incision clean and dry for 3 days).  Wound check scheduled and entered in AVS.  Please call with questions.   Gypsy Balsam, NP 04/26/2017 12:48 PM  I have seen, examined the patient, and reviewed the above assessment and plan.  Changes to above are made where necessary.  On exam, RRR.  Risks  And benefits of ILR discussed with the patient who wishes to proceed at this time.  Co Sign: Hillis Range, MD 04/26/2017 1:43 PM

## 2017-04-27 ENCOUNTER — Encounter (HOSPITAL_COMMUNITY): Payer: Self-pay | Admitting: Internal Medicine

## 2017-05-07 ENCOUNTER — Telehealth: Payer: Self-pay | Admitting: Nurse Practitioner

## 2017-05-07 NOTE — Telephone Encounter (Signed)
New message  Pt call requesting to speak with RN about resetting his device. Pt would also like to ask about taking off his bandages. Please call back to discuss

## 2017-05-07 NOTE — Telephone Encounter (Signed)
Informed patient that Carelink monitor is working. Also informed patient that his steri strips would be removed on 10/31 @ his wound check appt. Patient verbalized understanding.

## 2017-05-09 ENCOUNTER — Ambulatory Visit: Payer: Medicare Other

## 2017-05-09 ENCOUNTER — Ambulatory Visit (INDEPENDENT_AMBULATORY_CARE_PROVIDER_SITE_OTHER): Payer: Self-pay | Admitting: *Deleted

## 2017-05-09 DIAGNOSIS — I639 Cerebral infarction, unspecified: Secondary | ICD-10-CM

## 2017-05-09 NOTE — Progress Notes (Signed)
Wound Loop check in clinic. Steri- Strips removed, wound well healed, incision edges approximated. No redness or swelling. Battery status: Good. R-waves  0.14 mV. 0 symptom episodes, 0 tachy episodes,  Pause and brady programmed off at implant. 0 AF episodes. Monthly summary reports and ROV with JA PRN.

## 2017-05-28 ENCOUNTER — Ambulatory Visit (INDEPENDENT_AMBULATORY_CARE_PROVIDER_SITE_OTHER): Payer: Medicare Other | Admitting: *Deleted

## 2017-05-28 DIAGNOSIS — I639 Cerebral infarction, unspecified: Secondary | ICD-10-CM

## 2017-05-28 LAB — CUP PACEART REMOTE DEVICE CHECK
Date Time Interrogation Session: 20181117204620
MDC IDC PG IMPLANT DT: 20181018

## 2017-05-29 ENCOUNTER — Encounter: Payer: Self-pay | Admitting: Occupational Therapy

## 2017-05-29 ENCOUNTER — Ambulatory Visit: Payer: Medicare Other | Admitting: Occupational Therapy

## 2017-05-29 ENCOUNTER — Encounter: Payer: Self-pay | Admitting: Physical Therapy

## 2017-05-29 ENCOUNTER — Ambulatory Visit: Payer: Medicare Other | Attending: Neurology | Admitting: Physical Therapy

## 2017-05-29 DIAGNOSIS — R278 Other lack of coordination: Secondary | ICD-10-CM

## 2017-05-29 DIAGNOSIS — M6281 Muscle weakness (generalized): Secondary | ICD-10-CM

## 2017-05-29 NOTE — Therapy (Signed)
[place] [facility] [address] [place], [place], [address] Phone: [phone]   Fax:  [phone]  Physical Therapy Evaluation  Patient Details  Name: Eugene Dudley MRN: [id number] Date of Birth: 06/16/1941 Referring Provider: Micki RileySethi, Pramod S,   Encounter Date: 05/29/2017  PT End of Session - 05/29/17 1317    Visit Number  1    Number of Visits  1    Date for PT Re-Evaluation  --    Authorization Type  g codes    PT Start Time  0105    PT Stop Time  0145    PT Time Calculation (min)  40 min    Equipment Utilized During Treatment  Gait belt    Activity Tolerance  Patient tolerated treatment well;Patient limited by fatigue    Behavior During Therapy  [facility] for tasks assessed/performed       Past Medical History:  Diagnosis Date  . Diabetes mellitus without complication (HCC)   . Hypercholesterolemia   . Hypertension   . Swollen ankles   . TIA (transient ischemic attack) 2011    Past Surgical History:  Procedure Laterality Date  . CATARACT EXTRACTION Left 2012  . CYSTOSCOPY  2015   [facility]  . HERNIA REPAIR    . LOOP RECORDER INSERTION N/A 04/26/2017   Procedure: LOOP RECORDER INSERTION;  Surgeon: Hillis RangeAllred, James, MD;  Location: [facility] INVASIVE CV LAB;  Service: Cardiovascular;  Laterality: N/A;  . TONSILLECTOMY      There were no vitals filed for this visit.   Subjective Assessment - 05/29/17 1308    Subjective  Patient had a cva about 1-2 months ago. Patient has some standing balance deficits, trouble recalling words.     Patient is accompained by:  Family member    Pertinent History  Patient had a CVA and went to the hospital the next day. He went to the hospital for 4 days and he went home. He did not have PT at home and was recommended out patinet therapy. He had a CVA in 2001.     Limitations  Walking    How long can you stand comfortably?  15 mins    How long can you walk comfortably?  5 mins    Patient Stated  Goals  to be able to walk better    Currently in Pain?  No/denies    Multiple Pain Sites  No         [facility] PT Assessment - 05/29/17 0001      Assessment   Medical Diagnosis  cva    Referring Provider  Pearlean BrownieSethi, Pramod S,    Hand Dominance  Right    Prior Therapy  no      Precautions   Precautions  None      Restrictions   Weight Bearing Restrictions  No      Balance Screen   Has the patient fallen in the past 6 months  No    Has the patient had a decrease in activity level because of a fear of falling?   Yes    Is the patient reluctant to leave their home because of a fear of falling?   No      Home Nurse, mental healthnvironment   Living Environment  Private residence    Living Arrangements  Alone    Available Help at Discharge  Friend(s)    Type of Home  House    Home Access  Stairs to enter  Entrance Stairs-Number of Steps  6    Entrance Stairs-Rails  Right;Left    Home Layout  One level    Home Equipment  Gilmer Mor - single point      Prior Function   Level of Independence  Independent with basic ADLs;Independent with transfers;Independent with gait    Vocation  Retired    Leisure  go out to dinner, tv        POSTURE: WNL   PROM/AROM:  WFL  STRENGTH:  Graded on a 0-5 scale Muscle Group Left Right  Shoulder flex 5/5 5/5  Shoulder Abd 5/5 5/5  Shoulder Ext 5/5 5/5  Shoulder IR/ER 5/5 5/5  Elbow 5/5 5/5  Wrist/hand 5/5 5/5  Hip Flex 5/5 5/5  Hip Abd 5/5 5/5  Hip Add 4/5 4/5  Hip Ext 5/5 5/5  Hip IR/ER 5/5 5/5  Knee Flex 5/5 5/5  Knee Ext 5/5 5/5  Ankle DF 5/5 5/5  Ankle PF 3/5 3/5   SENSATION: WFL BUE and BLE  FUNCTIONAL MOBILITY: independent with transfers and bed mobility  BALANCE: normal for dynamic standing and static standing Static Standing Balance  Normal Able to maintain standing balance against maximal resistance x  Good Able to maintain standing balance against moderate resistance   Good-/Fair+ Able to maintain standing balance against minimal resistance    Fair Able to stand unsupported without UE support and without LOB for 1-2 min   Fair- Requires Min A and UE support to maintain standing without loss of balance   Poor+ Requires mod A and UE support to maintain standing without loss of balance   Poor Requires max A and UE support to maintain standing balance without loss    Standing Dynamic Balance  Normal Stand independently unsupported, able to weight shift and cross midline maximally x  Good Stand independently unsupported, able to weight shift and cross midline moderately   Good-/Fair+ Stand independently unsupported, able to weight shift across midline minimally   Fair Stand independently unsupported, weight shift, and reach ipsilaterally, loss of balance when crossing midline   Poor+ Able to stand with Min A and reach ipsilaterally, unable to weight shift   Poor Able to stand with Mod A and minimally reach ipsilaterally, unable to cross midline.       GAIT: independent without AD for intermediate and short distances, no gait deviations  OUTCOME MEASURES: TEST Outcome Interpretation  5 times sit<>stand 14.20sec >78 yo, >15 sec indicates increased risk for falls  10 meter walk test   1.42               m/s <1.0 m/s indicates increased risk for falls; limited community ambulator  Timed up and Go    7.66             sec <14 sec indicates increased risk for falls                 Treatment: HEP education for standing heel raises x 20  Seated ankle eversion with GTB x 20  Good tolerance with no pain      Objective measurements completed on examination: See above findings.              PT Education - 05/29/17 1315    Education provided  Yes    Education Details  plan of care    Person(s) Educated  Patient    Methods  Explanation    Comprehension  Verbalized understanding;Returned demonstration  PT Long Term Goals - 05/29/17 1352      PT LONG TERM GOAL #1   Title  Patient will be independent with  HEP to improve ankle strength to prevent falls.     Baseline  3/5 BLE ankle DF    Time  1    Period  Days    Status  Achieved    Target Date  05/29/17             Plan - 05/29/17 1348    Clinical Impression Statement  Patient is 76 yr old male s/p cva. He presents with 5/5 muscle strength except bilateral ankle plantar flexion 3/5 BLE. He has good static and good dynamic standing balance and is not in a falls risk . He ambulates independently without AD device for household and intermediate distances. He is independent with transfers and all mobility.  He was evaluated and recommended a HEP for ankle PF weakness and no skilled PT is recommended at this time.     Clinical Presentation  Stable    Clinical Decision Making  Low    Rehab Potential  Good    PT Frequency  One time visit    Consulted and Agree with Plan of Care  Patient;Other (Comment)       Patient will benefit from skilled therapeutic intervention in order to improve the following deficits and impairments:  Decreased strength  Visit Diagnosis: Muscle weakness (generalized)  G-Codes - 05/29/17 1353    Functional Assessment Tool Used (Outpatient Only)  TUG, 5 x sit to stand, 10 MW    Functional Limitation  Mobility: Walking and moving around    Mobility: Walking and Moving Around Current Status [phone]([id number])  0 percent impaired, limited or restricted    Mobility: Walking and Moving Around Goal Status [phone]([id number])  0 percent impaired, limited or restricted    Mobility: Walking and Moving Around Discharge Status [phone]([phone])  0 percent impaired, limited or restricted        Problem List Patient Active Problem List   Diagnosis Date Noted  . CVA (cerebral vascular accident) (HCC) 04/24/2017  . 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 Stroke (HCC) 02/03/2015  . 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 Essential hypertension 02/03/2015  . HLD (hyperlipidemia) 02/03/2015  . Hyperlipidemia 11/16/2014  . DM  (diabetes mellitus) (HCC) 11/16/2014    Ezekiel InaMansfield, Edger Husain S, PT DPT 05/29/2017, 1:54 PM  [facility] [address] [place], [place], [address] Phone: [phone]   Fax:  [phone]  Name: Eugene Franklinlan R Kitson MRN: [id number] Date of Birth: 09/03/1940

## 2017-05-29 NOTE — Therapy (Signed)
[facility] [address] [place], [place], [address] Phone: [phone]   Fax:  [phone]  Occupational Therapy Evaluation  Patient Details  Name: Eugene Dudley MRN: [id number] Date of Birth: 03/31/1941 Referring Provider: Dr. Pearlean BrownieSethi   Encounter Date: 05/29/2017  OT End of Session - 05/29/17 1704    Visit Number  1    Number of Visits  1    OT Start Time  1400    OT Stop Time  1500    OT Time Calculation (min)  60 min    Activity Tolerance  Patient tolerated treatment well    Behavior During Therapy  [facility] for tasks assessed/performed       Past Medical History:  Diagnosis Date  . Diabetes mellitus without complication (HCC)   . Hypercholesterolemia   . Hypertension   . Swollen ankles   . TIA (transient ischemic attack) 2011    Past Surgical History:  Procedure Laterality Date  . CATARACT EXTRACTION Left 2012  . CYSTOSCOPY  2015   Kidney  . HERNIA REPAIR    . LOOP RECORDER INSERTION N/A 04/26/2017   Procedure: LOOP RECORDER INSERTION;  Surgeon: Hillis RangeAllred, James, MD;  Location: [facility] INVASIVE CV LAB;  Service: Cardiovascular;  Laterality: N/A;  . TONSILLECTOMY      There were no vitals filed for this visit.  Subjective Assessment - 05/29/17 1509    Subjective   Pt. reports he is not going to have PT.    Patient is accompained by:  Family member    Pertinent History  Pt. is a 76 y.o. male who suffered a CVA. 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 [facility], and was a candidate for TPA. Pt. has a heart monitor with a chip insert. Pt. is now ready for outpatient OT services.    Currently in Pain?  No/denies        [facility] - 05/29/17 1404      Assessment   Diagnosis  CVA    Referring Provider  Dr. Pearlean BrownieSethi    Onset Date  04/25/17      Balance Screen   Has the patient fallen in the past 6 months  No    Has the patient had a decrease in activity level because of a fear of  falling?   No    Is the patient reluctant to leave their home because of a fear of falling?   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    Leisure  Going out to dinner, shooting guns      ADL   Eating/Feeding  Independent    Grooming  Independent    Education officer, communityUpper Body Bathing  Independent    Lower Body Bathing  Independent    Upper Body  Dressing  Increased time;Independent    Lower Body Dressing  Increased time    Toileting -  Geneticist, molecular  Modified independent      IADL   Shopping  Takes care of all shopping needs independently    Light Housekeeping  Performs light daily tasks but cannot maintain acceptable level of cleanliness    Meal Prep  Able to complete simple warm meal prep    Medication Management  Is responsible for taking medication in correct dosages at correct time    Financial Management  Manages financial matters independently (budgets, writes checks, pays rent, bills goes to bank), collects and keeps track of income      Written Expression   Dominant Hand  Right    Handwriting  100% legible      Vision - History   Baseline Vision  Bifocals      Activity Tolerance   Activity Tolerance  Tolerates 10-20 min activity with muiltiple rests      Cognition   Overall Cognitive Status  Within Functional Limits for tasks assessed      Sensation   Light Touch  Appears Intact    Proprioception  Appears Intact       Coordination   Right 9 Hole Peg Test  25    Left 9 Hole Peg Test  28      Strength   Overall Strength Comments  BUE strength: 5/5 overall      Hand Function   Right Hand Grip (lbs)  80    Right Hand Lateral Pinch  18 lbs    Left Hand Grip (lbs)  75    Left Hand Lateral Pinch  20 lbs                      OT Education - Jun 27, 2017 1707    Education provided  Yes    Education Details  OT services, UE functioning, ADL functioning.    Person(s) Educated  Patient    Methods  Explanation    Comprehension  Verbalized understanding;Returned demonstration                 Plan - Jun 27, 2017 1707    Clinical Impression Statement  Pt. is a 76 y.o. male who was suffered a CVA. Pt. reports being at his PLOF with ADL, and IADL functioning, UE strength, and [facility] skills. Pt. MAM-20 score was 79/80. No further OT services are warranted at this time. Discharge OT services. Pt reports initially having speech impairments at the onset of the stroke. Pt. reports being concerned about having word finding impairments. Pt. education was provided about Speech Therapy services, and requesting a referral from his Physician if he decides to pursue it. Pt. reports he will wait to see if it improves on its own.     Plan  To discharge OT services    Recommended Other Services  Speech Therapy    Consulted and Agree with Plan of Care  Patient       Patient will benefit from skilled therapeutic intervention in order to improve the following deficits and impairments:     Visit Diagnosis: Muscle weakness (generalized)  Other lack of coordination  G-Codes - June 27, 2017 1724    Functional Assessment Tool Used (Outpatient only)  Clinical judgement, MAM-20, 9-hole peg test    Functional Limitation  Self care    Self Care Current Status (W0981)  0 percent impaired, limited or restricted  Self Care Goal Status [phone]([id number])  0 percent impaired, limited or restricted    Self Care Discharge Status [phone])   0 percent impaired, limited or restricted       Problem List Patient Active Problem List   Diagnosis Date Noted  . CVA (cerebral vascular accident) (HCC) 04/24/2017  . 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 Stroke (HCC) 02/03/2015  . 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 Essential hypertension 02/03/2015  . HLD (hyperlipidemia) 02/03/2015  . Hyperlipidemia 11/16/2014  . DM (diabetes mellitus) (HCC) 11/16/2014    Olegario MessierElaine Adna Nofziger, MS, OTR/L 05/29/2017, 5:25 PM  [place] [facility] [address] [place], [place], [address] Phone: [phone]   Fax:  [phone]  Name: Eugene Dudley MRN: [id number] Date of Birth: 01/20/1941

## 2017-05-29 NOTE — Progress Notes (Signed)
Carelink Summary Report / Loop Recorder 

## 2017-05-29 NOTE — Addendum Note (Signed)
Addended by: Avon GullyJAGENTENFL, Loreta Blouch M on: 05/29/2017 05:28 PM   Modules accepted: Orders

## 2017-06-05 ENCOUNTER — Encounter: Payer: Medicare Other | Admitting: Occupational Therapy

## 2017-06-05 ENCOUNTER — Ambulatory Visit: Payer: Medicare Other | Admitting: Physical Therapy

## 2017-06-11 ENCOUNTER — Telehealth: Payer: Self-pay | Admitting: Neurology

## 2017-06-11 NOTE — Telephone Encounter (Signed)
Pt called and lm on vm stating that he is needing a sooner apt with Dr. Pearlean BrownieSethi. Patient said that he thinks that he may be having another stroke. Please call pt ASAP!

## 2017-06-11 NOTE — Telephone Encounter (Signed)
Patient has a hospital follow up on 06-25-17 with Dr. Pearlean BrownieSethi.  2 weeks ago he had 2 episodes where he could not talk. One lasted  for 2 hours and the other lasted less than an hour. He has had no more symptoms and did not go to the ED. He thinks he should be seen sooner. Please call  and discuss.

## 2017-06-11 NOTE — Telephone Encounter (Signed)
I spoke to pt.  He stated that [facility], and since being discharged has had 2 episodes of not being able to talk.  (one 2wks ago- could not speak for an hour and another a week ago which he stated could not speak for 30 minutes). He did not display any other sx.  He did not go to ER or call 911.  He is taking aspirn 325mg  po daily at bedtime and his other medications.    He has not seen his pcp.  He lives alone, and has stepsister that lives here locally and checks on him every day or so.  I instructed that if he has sx of stroke he is to go to ED or call 911. He verbalized understanding.  He has appt 06-25-17 at 1400 and he thinks he needs to be seen sooner if having these episodes.  I relayed that will check with Dr. Pearlean BrownieSethi if wants to add anything prior to being seen.  Please advise.

## 2017-06-11 NOTE — Telephone Encounter (Signed)
Okay to move to a walk-in slot if available with me or with  Nurse practitioner if not keep appointment for 06/15/17

## 2017-06-11 NOTE — Telephone Encounter (Signed)
  Guy BeginYoung, Sandra S, RN 2 hours ago (2:37 PM)      I spoke to pt.  He stated that [facility], and since being discharged has had 2 episodes of not being able to talk.  (one 2wks ago- could not speak for an hour and another a week ago which he stated could not speak for 30 minutes). He did not display any other sx.  He did not go to ER or call 911.  He is taking aspirn 325mg  po daily at bedtime and his other medications.    He has not seen his pcp.  He lives alone, and has stepsister that lives here locally and checks on him every day or so.  I instructed that if he has sx of stroke he is to go to ED or call 911. He verbalized understanding.  He has appt 06-25-17 at 1400 and he thinks he needs to be seen sooner if having these episodes.  I relayed that will check with Dr. Pearlean BrownieSethi if wants to add anything prior to being seen.  Please advise.

## 2017-06-12 ENCOUNTER — Ambulatory Visit: Payer: Medicare Other | Admitting: Physical Therapy

## 2017-06-12 ENCOUNTER — Encounter: Payer: Medicare Other | Admitting: Occupational Therapy

## 2017-06-12 NOTE — Telephone Encounter (Signed)
Patient has appt with Dr. Pearlean BrownieSEthi on 06/25/2017.

## 2017-06-12 NOTE — Telephone Encounter (Signed)
Called and spoke to pt and offered today at 1515 with CM/NP.   He could not make it.  I relayed that will keep a look out for cancellations and will call and see if can come in when available.  He was appreciative of this.

## 2017-06-14 ENCOUNTER — Ambulatory Visit: Payer: Medicare Other | Admitting: Physical Therapy

## 2017-06-14 ENCOUNTER — Encounter: Payer: Medicare Other | Admitting: Occupational Therapy

## 2017-06-19 ENCOUNTER — Ambulatory Visit: Payer: Medicare Other | Admitting: Physical Therapy

## 2017-06-19 ENCOUNTER — Encounter: Payer: Medicare Other | Admitting: Occupational Therapy

## 2017-06-21 ENCOUNTER — Encounter: Payer: Medicare Other | Admitting: Occupational Therapy

## 2017-06-21 ENCOUNTER — Encounter: Payer: Medicare Other | Admitting: Physical Therapy

## 2017-06-25 ENCOUNTER — Ambulatory Visit (INDEPENDENT_AMBULATORY_CARE_PROVIDER_SITE_OTHER): Payer: Medicare Other | Admitting: *Deleted

## 2017-06-25 ENCOUNTER — Ambulatory Visit (INDEPENDENT_AMBULATORY_CARE_PROVIDER_SITE_OTHER): Payer: Medicare Other | Admitting: Neurology

## 2017-06-25 ENCOUNTER — Encounter: Payer: Self-pay | Admitting: Neurology

## 2017-06-25 VITALS — BP 115/80 | HR 92 | Ht 71.0 in | Wt 240.6 lb

## 2017-06-25 DIAGNOSIS — I639 Cerebral infarction, unspecified: Secondary | ICD-10-CM

## 2017-06-25 DIAGNOSIS — R4789 Other speech disturbances: Secondary | ICD-10-CM | POA: Diagnosis not present

## 2017-06-25 MED ORDER — CLOPIDOGREL BISULFATE 75 MG PO TABS: 75.0000 mg | ORAL_TABLET | Freq: Every day | ORAL | 11 refills | Status: AC

## 2017-06-25 NOTE — Patient Instructions (Signed)
I had a long d/w patient and his wife about his recent episodes of recurrent transient speech disturbance, risk for recurrent stroke/TIAs, personally independently reviewed imaging studies and stroke evaluation results and answered questions.Continue  aspirin 325 mg daily and add clopidogrel 75 mg daily for 3 months for secondary stroke prevention and maintain strict control of hypertension with blood pressure goal below 130/90, diabetes with hemoglobin A1c goal below 6.5% and lipids with LDL cholesterol goal below 70 mg/dL. I also advised the patient to eat a healthy diet with plenty of whole grains, cereals, fruits and vegetables, exercise regularly and maintain ideal body weight .check EEG for seizure activity and MRI scan the brain with and without contrast. Followup in the future with my nurse practitioner Shanda BumpsJessica in 3 months or call earlier if necessary Stroke Prevention Some medical conditions and behaviors are associated with an increased chance of having a stroke. You may prevent a stroke by making healthy choices and managing medical conditions. How can I reduce my risk of having a stroke?  Stay physically active. Get at least 30 minutes of activity on most or all days.  Do not smoke. It may also be helpful to avoid exposure to secondhand smoke.  Limit alcohol use. Moderate alcohol use is considered to be: ? No more than 2 drinks per day for men. ? No more than 1 drink per day for nonpregnant women.  Eat healthy foods. This involves: ? Eating 5 or more servings of fruits and vegetables a day. ? Making dietary changes that address high blood pressure (hypertension), high cholesterol, diabetes, or obesity.  Manage your cholesterol levels. ? Making food choices that are high in fiber and low in saturated fat, trans fat, and cholesterol may control cholesterol levels. ? Take any prescribed medicines to control cholesterol as directed by your health care provider.  Manage your  diabetes. ? Controlling your carbohydrate and sugar intake is recommended to manage diabetes. ? Take any prescribed medicines to control diabetes as directed by your health care provider.  Control your hypertension. ? Making food choices that are low in salt (sodium), saturated fat, trans fat, and cholesterol is recommended to manage hypertension. ? Ask your health care provider if you need treatment to lower your blood pressure. Take any prescribed medicines to control hypertension as directed by your health care provider. ? If you are 1818-78 years of age, have your blood pressure checked every 3-5 years. If you are 78 years of age or older, have your blood pressure checked every year.  Maintain a healthy weight. ? Reducing calorie intake and making food choices that are low in sodium, saturated fat, trans fat, and cholesterol are recommended to manage weight.  Stop drug abuse.  Avoid taking birth control pills. ? Talk to your health care provider about the risks of taking birth control pills if you are over 78 years old, smoke, get migraines, or have ever had a blood clot.  Get evaluated for sleep disorders (sleep apnea). ? Talk to your health care provider about getting a sleep evaluation if you snore a lot or have excessive sleepiness.  Take medicines only as directed by your health care provider. ? For some people, aspirin or blood thinners (anticoagulants) are helpful in reducing the risk of forming abnormal blood clots that can lead to stroke. If you have the irregular heart rhythm of atrial fibrillation, you should be on a blood thinner unless there is a good reason you cannot take them. ? Understand all your  medicine instructions.  Make sure that other conditions (such as anemia or atherosclerosis) are addressed. Get help right away if:  You have sudden weakness or numbness of the face, arm, or leg, especially on one side of the body.  Your face or eyelid droops to one  side.  You have sudden confusion.  You have trouble speaking (aphasia) or understanding.  You have sudden trouble seeing in one or both eyes.  You have sudden trouble walking.  You have dizziness.  You have a loss of balance or coordination.  You have a sudden, severe headache with no known cause.  You have new chest pain or an irregular heartbeat. Any of these symptoms may represent a serious problem that is an emergency. Do not wait to see if the symptoms will go away. Get medical help at once. Call your local emergency services (911 in [place] Do not drive yourself to the hospital. This information is not intended to replace advice given to you by your health care provider. Make sure you discuss any questions you have with your health care provider. Document Released: 08/03/2004 Document Revised: 12/02/2015 Document Reviewed: 12/27/2012 Elsevier Interactive Patient Education  2017 [employer]

## 2017-06-25 NOTE — Progress Notes (Signed)
[facility] [address] [place]. [place] [address] [phone]       OFFICE FOLLOW-UP NOTE  Mr. Vedh Ptacek Quinteros Date of Birth:  Jul 26, 1940 Medical Record Number:  [id number]   HPI: Mr. Ambroise is a 76 year old Caucasian male seen today for the first office follow-up visit following hospital admission for TIA in October 2018.  History is obtained from the patient, his wife as well as review of electronic medical records.  I have personally reviewed imaging films.Baird Polinski Matlockis an 76 y.o.malepresenting to the hospital via EMS for garbled speech. All history is obtained from EMS and the chart as patient is unable to provide any history. The patient called his sister on the phone at around 10:30 AM, he sounded normal according to report but he said that he wasn't feeling right. There is no details available of what wasn't right at that time. The sister called EMS. When EMS arrived, he was slurring his words, making no sense in terms of his speech. No other focal deficits were noted by EMS. He was brought in to the [facility] ER as an emergent code stroke for evaluation. The patient was unable to provide any history to [place] on the initial encounter. Chart review showed that he was admitted for headache with altered mental status, and an MRI that was done as a part of the evaluation showed a small cortical embolic looking infarcts in the left MCA PCA territory, which was thought to be an incidental finding at that time.He was admitted and initial imaging suggested posible ischemia in posterior L MCA distribution. He received tPA on 10/16 @1201 . Subsequent MRI did not show acute areas of infarction. He was monitored closely in the Neuro ICU without major complications. He was transferred to the floor and participated well with physical and occupational therapy recommending outpatient follow up. 2D echocardiogram was unremarkable. Initial ischemic pattern was concerning for embolic  mechanism, however TEE was deferred due to a history of tolerating endoscopy poorly. Electrophysiology was consulted for implantable loop recorder was placed as an outpatient.  Patient states a week after discharge he had another similar episode of speech disturbance where he was unable to speak but was fully awake and knew what was happening this lasted 2-3 hours.  He had a third episode another week later which lasted for 10 hours.  Patient denied headache before during or after these episodes.  Did not lose consciousness.  Patient states he is tolerating aspirin and Plavix without bleeding but does get bruise easily.  His blood pressure has been quite good and today it is 115/80 in office today.  His fasting sugars have all been satisfactory.  He is tolerating Zocor without muscle aches and pains.  He has no complaints today.  He denies any family history of dementia or speech language problems.  He denies any prior history of migraine headaches or seizures..       ROS:   14 system review of systems is positive for speech difficulty and all other systems negative  PMH:  Past Medical History:  Diagnosis Date  . Diabetes mellitus without complication (HCC)   . Hypercholesterolemia   . Hypertension   . Stroke (HCC)   . Swollen ankles   . TIA (transient ischemic attack) 2011    Social History:  Social History   Socioeconomic History  . Marital status: Married    Spouse name: Not on file  . Number of children: 0  . Years of education: HS  .  Highest education level: Not on file  Social Needs  . Financial resource strain: Not on file  . Food insecurity - worry: Not on file  . Food insecurity - inability: Not on file  . Transportation needs - medical: Not on file  . Transportation needs - non-medical: Not on file  Occupational History  . Occupation: Retired  Tobacco Use  . Smoking status: Former Smoker    Packs/day: 2.00    Years: 40.00    Pack years: 80.00    Types: Cigarettes      Last attempt to quit: 10/09/1999    Years since quitting: 17.7  . Smokeless tobacco: Former NeurosurgeonUser    Quit date: 10/09/1999  Substance and Sexual Activity  . Alcohol use: Yes    Alcohol/week: 0.6 oz    Types: 1 Cans of beer per week    Comment: may drink once a year  . Drug use: No  . Sexual activity: No  Other Topics Concern  . Not on file  Social History Narrative   Patient lives at home alone.   Caffeine use: 3-4 cups daily    Medications:   Current Outpatient Medications on File Prior to Visit  Medication Sig Dispense Refill  . alfuzosin (UROXATRAL) 10 MG 24 hr tablet Take 10 mg by mouth at bedtime.     Marland Kitchen. aspirin EC 325 MG tablet Take 1 tablet (325 mg total) by mouth daily. 100 tablet 3  . Carboxymethylcell-Hypromellose (GENTEAL OP) Apply 1 drop to eye as needed.    . Cholecalciferol 1000 UNITS capsule Take 2,000 Units by mouth 2 (two) times daily.     . clonazePAM (KLONOPIN) 1 MG tablet Take 0.5 mg by mouth 3 (three) times daily as needed for anxiety.     . metFORMIN (GLUCOPHAGE) 500 MG tablet Take 500 mg by mouth daily.     . Omega-3 Fatty Acids (FISH OIL) 1000 MG CAPS Take 1,000 mg by mouth at bedtime.     . pantoprazole (PROTONIX) 40 MG tablet Take 40 mg by mouth 2 (two) times daily.    Marland Kitchen. PARoxetine (PAXIL) 40 MG tablet Take 1 tablet (40 mg total) by mouth at bedtime. 30 tablet 11  . simvastatin (ZOCOR) 10 MG tablet Take 1 tablet (10 mg total) by mouth at bedtime. 30 tablet 11   No current facility-administered medications on file prior to visit.     Allergies:   Allergies  Allergen Reactions  . Omeprazole     Physical Exam General: well developed, well nourished elderly Caucasian male, seated, in no evident distress Head: head normocephalic and atraumatic.  Neck: supple with no carotid or supraclavicular bruits Cardiovascular: regular rate and rhythm, no murmurs Musculoskeletal: no deformity Skin:  no rash/petichiae Vascular:  Normal pulses all  extremities Vitals:   06/25/17 1429  BP: 115/80  Pulse: 92   Neurologic Exam Mental Status: Awake and fully alert. Oriented to place and time. Recent and remote memory intact.  But diminished recall 2/3.  Able to name 12 animals with 4 legs. attention span, concentration and fund of knowledge appropriate. Mood and affect appropriate.  Cranial Nerves: Fundoscopic exam reveals sharp disc margins. Pupils equal, briskly reactive to light. Extraocular movements full without nystagmus. Visual fields full to confrontation. Hearing intact. Facial sensation intact. Face, tongue, palate moves normally and symmetrically.  Motor: Normal bulk and tone. Normal strength in all tested extremity muscles. Sensory.: intact to touch ,pinprick .position and vibratory sensation.  Coordination: Rapid alternating movements normal in all  extremities. Finger-to-nose and heel-to-shin performed accurately bilaterally. Gait and Station: Arises from chair without difficulty. Stance is normal. Gait demonstrates normal stride length and balance . Able to heel, toe and tandem walk with moderate difficulty.  Reflexes: 1+ and symmetric except right ankle jerk is absent. Toes downgoing.   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  He was treated with IV TPA with full recovery but MRI did not show definite stroke-probable  left hemispheric TIAs versus simple partial seizure versus primary progressive aphasia.    PLAN: I had a long d/w patient and his wife about his recent episodes of recurrent transient speech disturbance, risk for recurrent stroke/TIAs, personally independently reviewed imaging studies and stroke evaluation results and answered questions.Continue  aspirin 325 mg daily and add clopidogrel 75 mg daily for 3 months for secondary stroke prevention and maintain strict control of hypertension with blood pressure goal  below 130/90, diabetes with hemoglobin A1c goal below 6.5% and lipids with LDL cholesterol goal below 70 mg/dL. I also advised the patient to eat a healthy diet with plenty of whole grains, cereals, fruits and vegetables, exercise regularly and maintain ideal body weight .check EEG for seizure activity and MRI scan the brain with and without contrast. Followup in the future with my nurse practitioner Shanda BumpsJessica in 3 months or call earlier if necessary Greater than 50% of time during this 25 minute visit was spent on counseling,explanation of diagnosis, planning of further management, discussion with patient and family and coordination of care Delia HeadyPramod Sethi, MD  [facility] [address] [place], [place] [address]  Phone [phone] Fax [phone] Note: This document was prepared with digital dictation and possible smart phrase technology. Any transcriptional errors that result from this process are unintentional

## 2017-06-26 ENCOUNTER — Ambulatory Visit: Payer: Medicare Other | Admitting: Physical Therapy

## 2017-06-26 ENCOUNTER — Encounter: Payer: Medicare Other | Admitting: Occupational Therapy

## 2017-06-26 ENCOUNTER — Ambulatory Visit (INDEPENDENT_AMBULATORY_CARE_PROVIDER_SITE_OTHER): Payer: Medicare Other

## 2017-06-26 DIAGNOSIS — R4701 Aphasia: Secondary | ICD-10-CM | POA: Diagnosis not present

## 2017-06-26 DIAGNOSIS — R4789 Other speech disturbances: Secondary | ICD-10-CM

## 2017-06-26 NOTE — Progress Notes (Signed)
Carelink Summary Report / Loop Recorder 

## 2017-06-28 ENCOUNTER — Telehealth: Payer: Self-pay | Admitting: Neurology

## 2017-06-28 ENCOUNTER — Encounter: Payer: Medicare Other | Admitting: Occupational Therapy

## 2017-06-28 NOTE — Telephone Encounter (Signed)
Pt said he had VM to call for EEG results.

## 2017-06-28 NOTE — Telephone Encounter (Signed)
Dr Pearlean BrownieSethi please call patient back for EEG results. You left him a vm per the result note.

## 2017-06-28 NOTE — Telephone Encounter (Signed)
I spoke with the patient and gave results of EEG showing focal left hemispheric cortical irritability but no definite seizure activity. I informed the patient that he may be at risk for focal seizures and if he has further recurrent episodes of speech difficulties or other seizure like activity given need to put him on seizure medications. He was instructed to call me if he had such activity. He voiced understanding. He was advised to keep his appointment to see me in 3 months.

## 2017-07-04 ENCOUNTER — Other Ambulatory Visit: Payer: Self-pay | Admitting: Neurology

## 2017-07-04 MED ORDER — DIVALPROEX SODIUM 500 MG PO DR TAB: 500.0000 mg | DELAYED_RELEASE_TABLET | Freq: Every day | ORAL | 2 refills | Status: AC

## 2017-07-04 NOTE — Telephone Encounter (Addendum)
Pt called said he had another episode that lasted about 10-15 minutes yesterday. Please call to advise at [phone] [facility]
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
## 2017-07-05 ENCOUNTER — Other Ambulatory Visit: Payer: Self-pay | Admitting: Internal Medicine

## 2017-07-05 ENCOUNTER — Encounter: Payer: Medicare Other | Admitting: Occupational Therapy

## 2017-07-05 ENCOUNTER — Ambulatory Visit: Payer: Medicare Other | Admitting: Physical Therapy

## 2017-07-05 NOTE — Telephone Encounter (Signed)
I called the patient yesterday and advised him to start Depakote DR 500 mg daily for seizure prophylaxis and not to drive till he can keep his follow-up appointment with me. He voiced understanding

## 2017-07-11 ENCOUNTER — Other Ambulatory Visit: Payer: Self-pay | Admitting: Internal Medicine

## 2017-07-16 LAB — CUP PACEART REMOTE DEVICE CHECK
Date Time Interrogation Session: 20181217203858
MDC IDC PG IMPLANT DT: 20181018

## 2017-07-25 ENCOUNTER — Ambulatory Visit (INDEPENDENT_AMBULATORY_CARE_PROVIDER_SITE_OTHER): Payer: Medicare Other | Admitting: *Deleted

## 2017-07-25 DIAGNOSIS — I639 Cerebral infarction, unspecified: Secondary | ICD-10-CM

## 2017-07-26 NOTE — Progress Notes (Signed)
Carelink Summary Report / Loop Recorder 

## 2017-08-03 LAB — CUP PACEART REMOTE DEVICE CHECK
Implantable Pulse Generator Implant Date: 20181018
MDC IDC SESS DTM: 20190116210937

## 2017-08-06 ENCOUNTER — Other Ambulatory Visit: Payer: Self-pay

## 2017-08-06 NOTE — Patient Outreach (Signed)
Telephone outreach to patient to obtain mRS was successfully completed. mRS = 1 

## 2017-08-15 ENCOUNTER — Telehealth: Payer: Self-pay | Admitting: Neurology

## 2017-08-15 NOTE — Telephone Encounter (Signed)
Sister-in-law is wanting to know what she needs to do with all the equipment the pt had. She stated it look expensive and didn't want to just throw it away
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
## 2017-08-15 NOTE — Telephone Encounter (Signed)
Patient's sister-in-law advised patient passed away 08/22/2017.

## 2017-08-15 NOTE — Telephone Encounter (Signed)
Thanks agree with plan as stated

## 2017-08-24 ENCOUNTER — Ambulatory Visit (INDEPENDENT_AMBULATORY_CARE_PROVIDER_SITE_OTHER): Payer: Medicare Other | Admitting: *Deleted

## 2017-08-24 DIAGNOSIS — I639 Cerebral infarction, unspecified: Secondary | ICD-10-CM

## 2017-08-27 NOTE — Progress Notes (Signed)
Carelink Summary Report / Loop Recorder 

## 2017-08-29 ENCOUNTER — Telehealth: Payer: Self-pay | Admitting: Cardiology

## 2017-08-29 NOTE — Telephone Encounter (Signed)
Spoke w/ pt family member and requested that he send a manual transmission b/c his home monitor has not updated in at least 14 days. Pt family member informed pt passed away on 08/26/2017

## 2017-09-07 DEATH — deceased

## 2017-09-17 ENCOUNTER — Ambulatory Visit: Payer: Medicare Other | Admitting: Neurology

## 2017-09-24 LAB — CUP PACEART REMOTE DEVICE CHECK
Date Time Interrogation Session: 20190215213923
Implantable Pulse Generator Implant Date: 20181018

## 2017-09-26 ENCOUNTER — Encounter: Payer: BLUE CROSS/BLUE SHIELD | Admitting: *Deleted

## 2018-04-11 IMAGING — CT CT HEAD CODE STROKE
4 series · 16 of 47 positions shown, 18 images · non-contrast
Comparison: Brain MRI, MRA and noncontrast head CT 11/16/2014 and
11/17/2014.

CLINICAL DATA: Code stroke. 68-year-old male with abnormal speech.
Last seen well 2070 hours.

EXAM:
CT HEAD WITHOUT CONTRAST
TECHNIQUE: Contiguous axial images were obtained from the base of the skull
through the vertex without intravenous contrast.

[Series 3: head wo · axial · 0.43mm/px · z∈[-100,+26]mm · 7 of 35 slices shown, 9 images]
[im 5/35  brain]
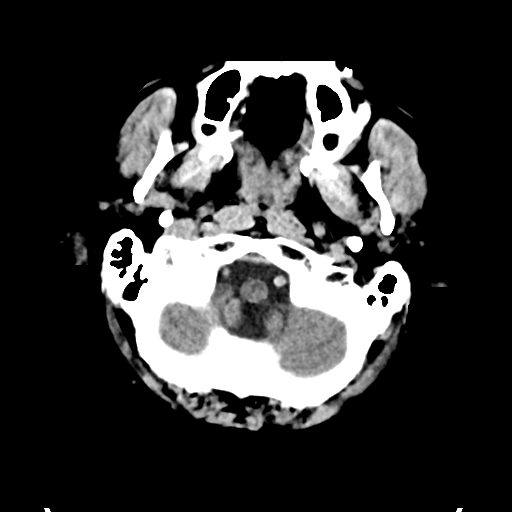
[im 5/35  bone]
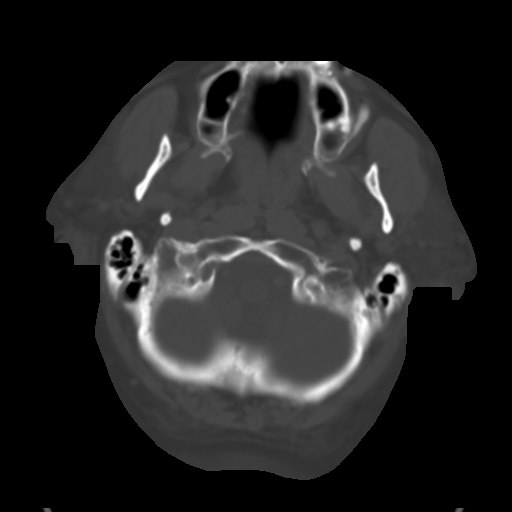
[im 9/35  brain]
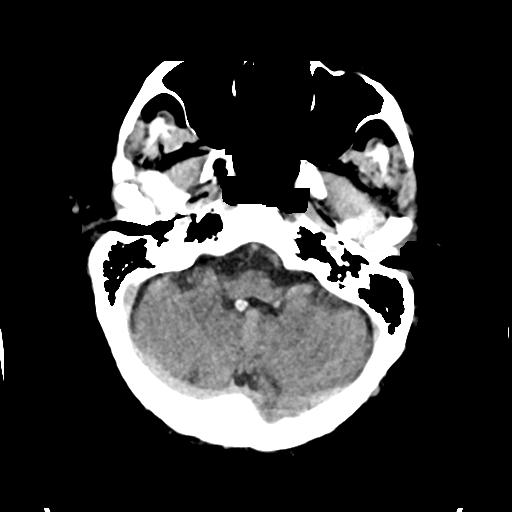
[im 13/35  brain]
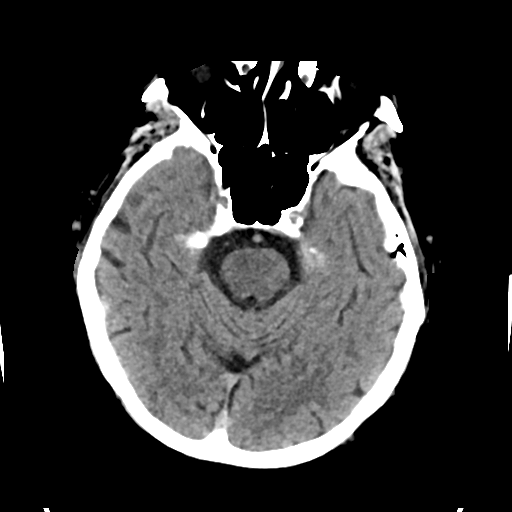
[im 18/35  brain]
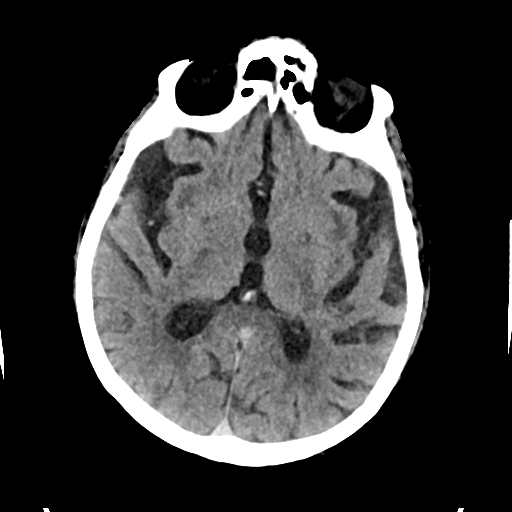
[im 22/35  brain]
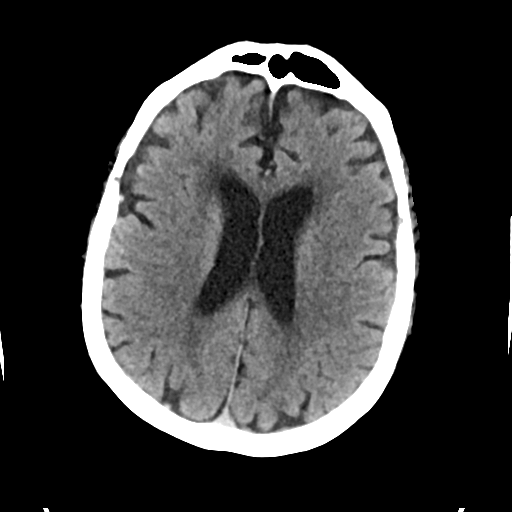
[im 22/35  bone]
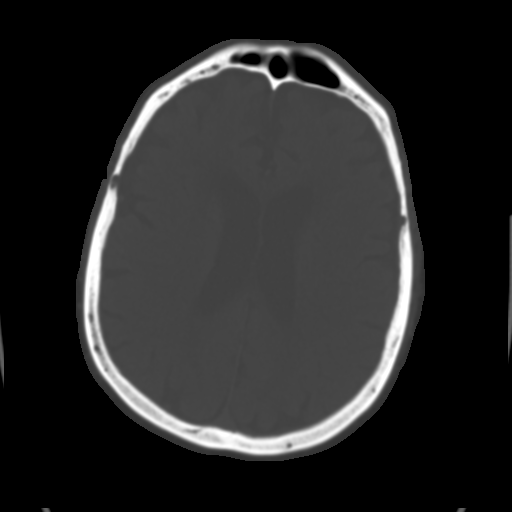
[im 26/35  brain]
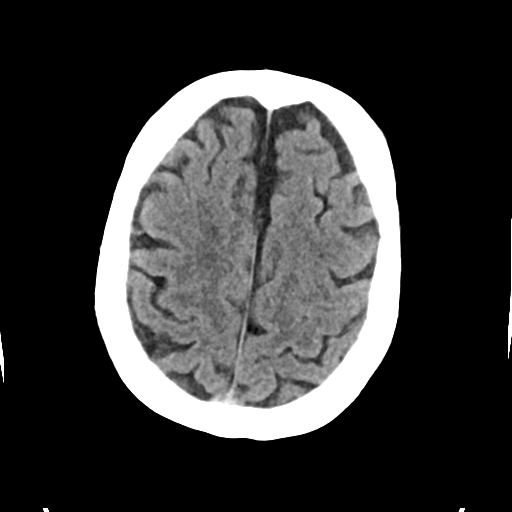
[im 30/35  brain]
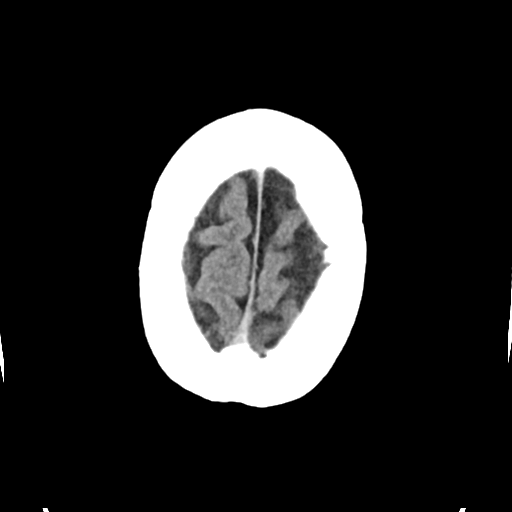

[Series 4: head bone · axial · 0.43mm/px · z∈[-104,-70]mm · 3 of 86 slices shown]
[im 9/86  bone]
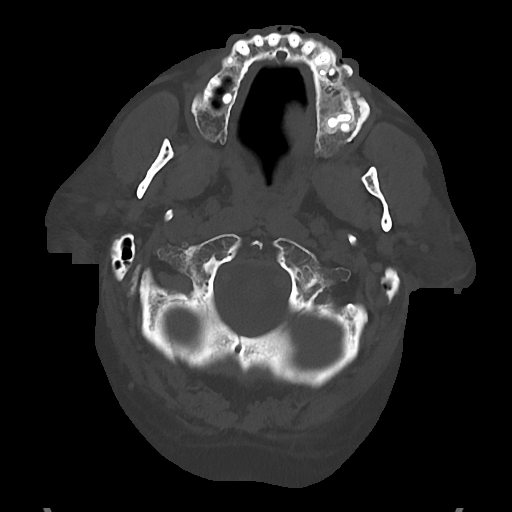
[im 18/86  bone]
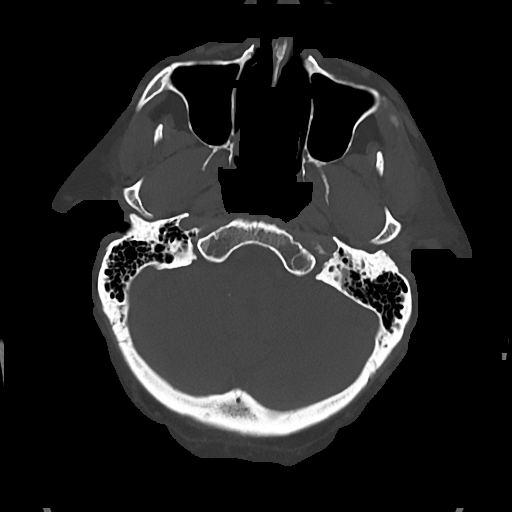
[im 26/86  bone]
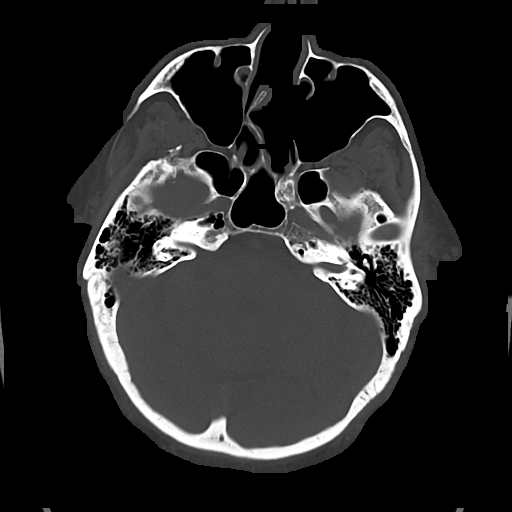

[Series 5: cor soft · coronal · 0.33mm/px · 3 of 67 slices shown]
[im 23/67  brain]
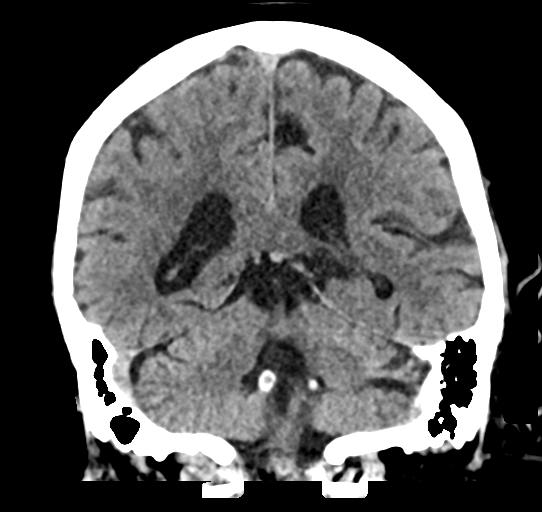
[im 30/67  brain]
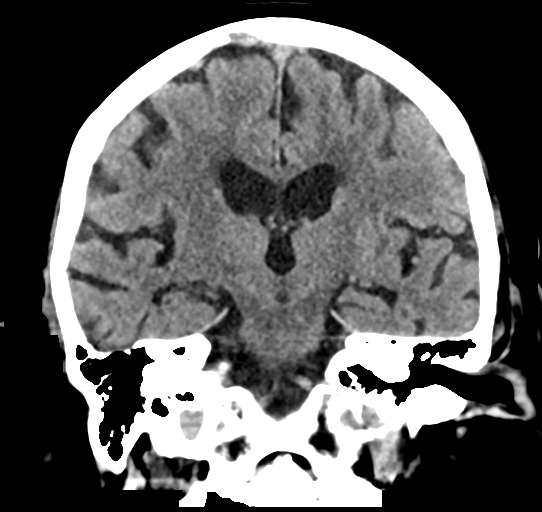
[im 37/67  brain]
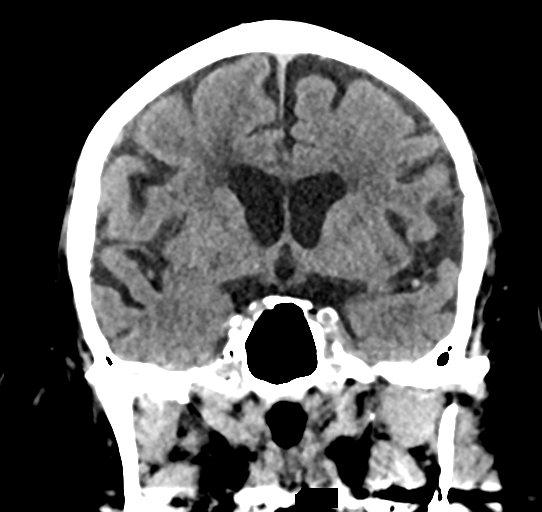

[Series 6: sag soft · sagittal · 0.33mm/px · 3 of 67 slices shown]
[im 23/67  brain]
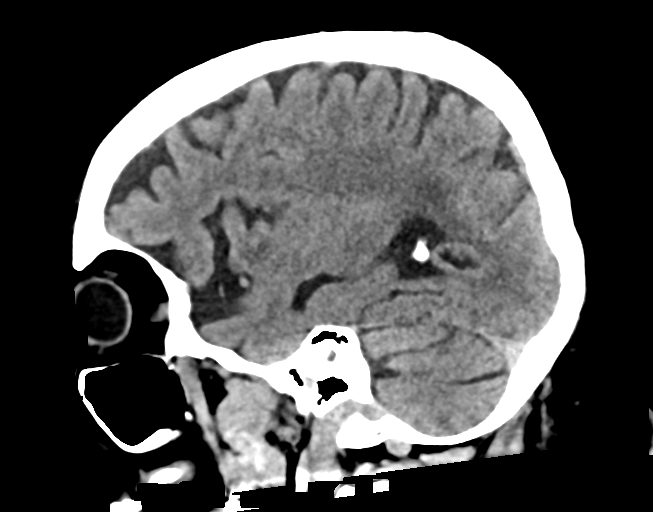
[im 34/67  brain]
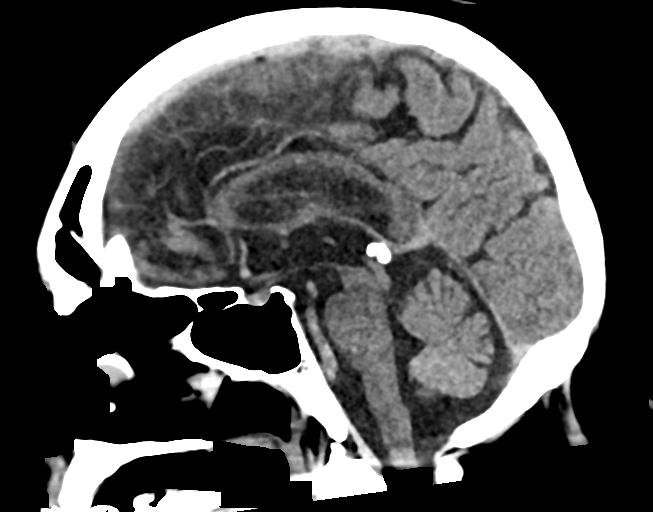
[im 45/67  brain]
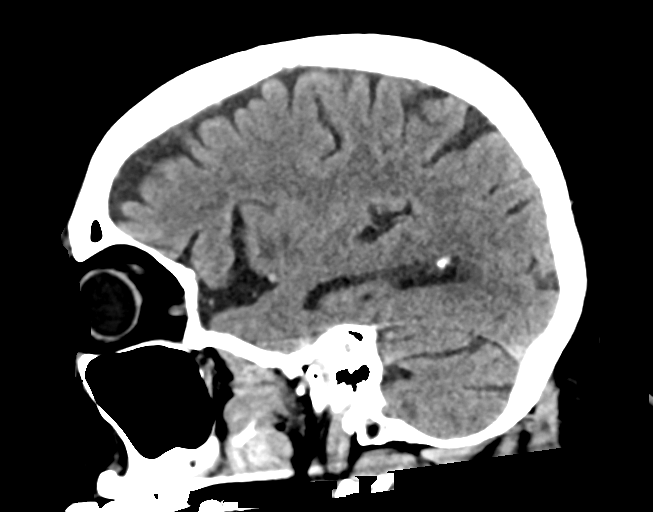

[16 of 47 positions shown; findings below may reference images not displayed]

FINDINGS: Brain: Cerebral volume is not significantly changed. No midline
shift, mass effect, or evidence of intracranial mass lesion. No
acute intracranial hemorrhage identified. No ventriculomegaly.

Patchy periventricular white matter hypodensity has progressed. No
cortical encephalomalacia identified. No cortically based acute
infarct identified.

Vascular: Calcified atherosclerosis at the skull base. No suspicious
intracranial vascular hyperdensity.

Skull: Stable.  No acute osseous abnormality identified.

Sinuses/Orbits: Chronic frontal sinus mucosal thickening has not
significantly changed. Otherwise clear.

Other: Stable orbit and scalp soft tissues.

ASPECTS (Alberta Stroke Program Early CT Score)

- Ganglionic level infarction (caudate, lentiform nuclei, internal
capsule, insula, M1-M3 cortex): 7

- Supraganglionic infarction (M4-M6 cortex): 3

Total score (0-10 with 10 being normal): 10
IMPRESSION: 1. Increased nonspecific cerebral white matter hypodensity since
6339, but otherwise stable with no acute hemorrhage or cortically
based infarct identified.
2. ASPECTS is 10.
3. Study discussed by telephone with Dr. GANAPATHI UMESH KUMAR on 04/24/2017
